# Patient Record
Sex: Female | Born: 1990 | Race: Black or African American | Hispanic: No | Marital: Married | Smoking: Never smoker
Health system: Southern US, Community
[De-identification: ages and names within clinical notes are randomized; demographics above are authoritative.]

## PROBLEM LIST (undated history)

## (undated) DIAGNOSIS — Z789 Other specified health status: Secondary | ICD-10-CM

## (undated) HISTORY — DX: Other specified health status: Z78.9

## (undated) HISTORY — PX: NO PAST SURGERIES: SHX2092

---

## 2013-05-11 DIAGNOSIS — L732 Hidradenitis suppurativa: Secondary | ICD-10-CM | POA: Insufficient documentation

## 2021-06-02 ENCOUNTER — Other Ambulatory Visit: Payer: Self-pay

## 2021-06-02 ENCOUNTER — Encounter: Payer: Self-pay | Admitting: Medical-Surgical

## 2021-06-02 ENCOUNTER — Ambulatory Visit (INDEPENDENT_AMBULATORY_CARE_PROVIDER_SITE_OTHER): Payer: 59 | Admitting: Medical-Surgical

## 2021-06-02 VITALS — BP 123/79 | HR 71 | Temp 99.1°F | Ht 68.0 in | Wt 164.9 lb

## 2021-06-02 DIAGNOSIS — M1712 Unilateral primary osteoarthritis, left knee: Secondary | ICD-10-CM

## 2021-06-02 DIAGNOSIS — Z114 Encounter for screening for human immunodeficiency virus [HIV]: Secondary | ICD-10-CM

## 2021-06-02 DIAGNOSIS — G8929 Other chronic pain: Secondary | ICD-10-CM

## 2021-06-02 DIAGNOSIS — L732 Hidradenitis suppurativa: Secondary | ICD-10-CM

## 2021-06-02 DIAGNOSIS — Z8349 Family history of other endocrine, nutritional and metabolic diseases: Secondary | ICD-10-CM

## 2021-06-02 DIAGNOSIS — Z1329 Encounter for screening for other suspected endocrine disorder: Secondary | ICD-10-CM

## 2021-06-02 DIAGNOSIS — Z7689 Persons encountering health services in other specified circumstances: Secondary | ICD-10-CM

## 2021-06-02 DIAGNOSIS — Z0000 Encounter for general adult medical examination without abnormal findings: Secondary | ICD-10-CM

## 2021-06-02 DIAGNOSIS — F419 Anxiety disorder, unspecified: Secondary | ICD-10-CM

## 2021-06-02 DIAGNOSIS — Z1159 Encounter for screening for other viral diseases: Secondary | ICD-10-CM

## 2021-06-02 DIAGNOSIS — M25511 Pain in right shoulder: Secondary | ICD-10-CM

## 2021-06-02 DIAGNOSIS — Z23 Encounter for immunization: Secondary | ICD-10-CM

## 2021-06-02 DIAGNOSIS — Z131 Encounter for screening for diabetes mellitus: Secondary | ICD-10-CM

## 2021-06-02 MED ORDER — CLINDAMYCIN PHOSPHATE 1 % EX SWAB: 1.0000 "application " | Freq: Two times a day (BID) | CUTANEOUS | 1 refills | Status: DC

## 2021-06-02 NOTE — Patient Instructions (Signed)
Tdap (Tetanus, Diphtheria, Pertussis) Vaccine: What You Need to Know 1. Why get vaccinated? Tdap vaccine can prevent tetanus, diphtheria, and pertussis. Diphtheria and pertussis spread from person to person. Tetanus enters the body through cuts or wounds.  TETANUS (T) causes painful stiffening of the muscles. Tetanus can lead to serious health problems, including being unable to open the mouth, having trouble swallowing and breathing, or death.  DIPHTHERIA (D) can lead to difficulty breathing, heart failure, paralysis, or death.  PERTUSSIS (aP), also known as "whooping cough," can cause uncontrollable, violent coughing that makes it hard to breathe, eat, or drink. Pertussis can be extremely serious especially in babies and young children, causing pneumonia, convulsions, brain damage, or death. In teens and adults, it can cause weight loss, loss of bladder control, passing out, and rib fractures from severe coughing. 2. Tdap vaccine Tdap is only for children 7 years and older, adolescents, and adults.  Adolescents should receive a single dose of Tdap, preferably at age 11 or 12 years. Pregnant people should get a dose of Tdap during every pregnancy, preferably during the early part of the third trimester, to help protect the newborn from pertussis. Infants are most at risk for severe, life-threatening complications from pertussis. Adults who have never received Tdap should get a dose of Tdap. Also, adults should receive a booster dose of either Tdap or Td (a different vaccine that protects against tetanus and diphtheria but not pertussis) every 10 years, or after 5 years in the case of a severe or dirty wound or burn. Tdap may be given at the same time as other vaccines. 3. Talk with your health care provider Tell your vaccine provider if the person getting the vaccine:  Has had an allergic reaction after a previous dose of any vaccine that protects against tetanus, diphtheria, or pertussis, or  has any severe, life-threatening allergies  Has had a coma, decreased level of consciousness, or prolonged seizures within 7 days after a previous dose of any pertussis vaccine (DTP, DTaP, or Tdap)  Has seizures or another nervous system problem  Has ever had Guillain-Barr Syndrome (also called "GBS")  Has had severe pain or swelling after a previous dose of any vaccine that protects against tetanus or diphtheria In some cases, your health care provider may decide to postpone Tdap vaccination until a future visit. People with minor illnesses, such as a cold, may be vaccinated. People who are moderately or severely ill should usually wait until they recover before getting Tdap vaccine.  Your health care provider can give you more information. 4. Risks of a vaccine reaction  Pain, redness, or swelling where the shot was given, mild fever, headache, feeling tired, and nausea, vomiting, diarrhea, or stomachache sometimes happen after Tdap vaccination. People sometimes faint after medical procedures, including vaccination. Tell your provider if you feel dizzy or have vision changes or ringing in the ears.  As with any medicine, there is a very remote chance of a vaccine causing a severe allergic reaction, other serious injury, or death. 5. What if there is a serious problem? An allergic reaction could occur after the vaccinated person leaves the clinic. If you see signs of a severe allergic reaction (hives, swelling of the face and throat, difficulty breathing, a fast heartbeat, dizziness, or weakness), call 9-1-1 and get the person to the nearest hospital. For other signs that concern you, call your health care provider.  Adverse reactions should be reported to the Vaccine Adverse Event Reporting System (VAERS). Your health   care provider will usually file this report, or you can do it yourself. Visit the VAERS website at www.vaers.hhs.gov or call 1-800-822-7967. VAERS is only for reporting  reactions, and VAERS staff members do not give medical advice. 6. The National Vaccine Injury Compensation Program The National Vaccine Injury Compensation Program (VICP) is a federal program that was created to compensate people who may have been injured by certain vaccines. Claims regarding alleged injury or death due to vaccination have a time limit for filing, which may be as short as two years. Visit the VICP website at www.hrsa.gov/vaccinecompensation or call 1-800-338-2382 to learn about the program and about filing a claim. 7. How can I learn more?  Ask your health care provider.  Call your local or state health department.  Visit the website of the Food and Drug Administration (FDA) for vaccine package inserts and additional information at www.fda.gov/vaccines-blood-biologics/vaccines.  Contact the Centers for Disease Control and Prevention (CDC): ? Call 1-800-232-4636 (1-800-CDC-INFO) or ? Visit CDC's website at www.cdc.gov/vaccines. Vaccine Information Statement Tdap (Tetanus, Diphtheria, Pertussis) Vaccine (08/01/2020) This information is not intended to replace advice given to you by your health care provider. Make sure you discuss any questions you have with your health care provider. Document Revised: 08/27/2020 Document Reviewed: 08/27/2020 Elsevier Patient Education  2021 Elsevier Inc.  

## 2021-06-02 NOTE — Progress Notes (Signed)
New Patient Office Visit  Subjective:  Patient ID: Melissa Sheppard, female    DOB: 1991-11-17  Age: 30 y.o. MRN: [id number]  CC:  Chief Complaint  Patient presents with  . Establish Care    HPI Melissa Sheppard presents to establish care. Last PCP was around 2018.   Chronic pain-does have a history of chronic pain with her left knee and right shoulder being affected.  She does go to a chiropractor once weekly and feels that this does help some.  She notes that the chiropractor discovered the osteoarthritis in her left knee.  She also has had loss cartilage in the right shoulder specifically the [facility] joint.  Notes that she did PRP in the past.  She does have a personal trainer history but feels like she needs more direction on correct exercises to do.  Not currently taking a regular anti-inflammatory but does occasionally use Aleve/ibuprofen/Motrin when it gets severe.  Hidradenitis suppurativa-was diagnosed with this many years ago and has had her bilateral axilla primarily affected.  She has had cysts and abscesses that required I&D in the past.  She was always given antibiotics after these procedures but preferred not to take them because they do cause yeast infections for her.  Most recently, she did have an abscess develop in the groin which was particularly painful.  She is interested in other options for managing her condition.   Past Medical History:  Diagnosis Date  . No pertinent past medical history     Past Surgical History:  Procedure Laterality Date  . NO PAST SURGERIES      Family History  Problem Relation Age of Onset  . Stroke Mother   . Diabetes Father   . Hypertension Other   . Breast cancer Cousin     Social History   Socioeconomic History  . Marital status: Married    Spouse name: Not on file  . Number of children: Not on file  . Years of education: Not on file  . Highest education level: Not on file  Occupational History  . Not on file  Tobacco  Use  . Smoking status: Never Smoker  . Smokeless tobacco: Never Used  Substance and Sexual Activity  . Alcohol use: Yes    Comment: occasionally  . Drug use: Never  . Sexual activity: Yes    Birth control/protection: Implant  Other Topics Concern  . Not on file  Social History Narrative  . Not on file   Social Determinants of Health   Financial Resource Strain: Not on file  Food Insecurity: Not on file  Transportation Needs: Not on file  Physical Activity: Not on file  Stress: Not on file  Social Connections: Not on file  Intimate Partner Violence: Not on file    ROS Review of Systems  Constitutional: Negative for chills, fatigue, fever and unexpected weight change.  Respiratory: Negative for cough, chest tightness, shortness of breath and wheezing.   Cardiovascular: Negative for chest pain, palpitations and leg swelling.  Musculoskeletal: Positive for arthralgias, myalgias and neck pain.  Skin:       Develops cysts in axilla and groin.  Neurological: Negative for dizziness, light-headedness and headaches.  Psychiatric/Behavioral: Positive for dysphoric mood. Negative for self-injury, sleep disturbance and suicidal ideas. The patient is nervous/anxious.     Objective:   Today's Vitals: BP 123/79   Pulse 71   Temp 99.1 F (37.3 C)   Ht 5\' 8"  (1.727 m)   Wt 164 lb 14.4  oz (74.8 kg)   LMP 05/27/2021   SpO2 100%   BMI 25.07 kg/m   Physical Exam Vitals reviewed.  Constitutional:      General: She is not in acute distress.    Appearance: Normal appearance. She is normal weight. She is not ill-appearing.  HENT:     Head: Normocephalic and atraumatic.  Cardiovascular:     Rate and Rhythm: Normal rate and regular rhythm.     Pulses: Normal pulses.     Heart sounds: Normal heart sounds. No murmur heard. No friction rub. No gallop.   Pulmonary:     Effort: Pulmonary effort is normal. No respiratory distress.     Breath sounds: Normal breath sounds. No wheezing.   Skin:    General: Skin is warm and dry.  Neurological:     Mental Status: She is alert and oriented to person, place, and time.  Psychiatric:        Mood and Affect: Mood normal.        Behavior: Behavior normal.        Thought Content: Thought content normal.        Judgment: Judgment normal.     Assessment & Plan:   1. Encounter to establish care Reviewed available information and discussed healthcare concerns with patient.  2. Need for Tdap vaccination Tdap given in office today. - Tdap vaccine greater than or equal to 7yo IM  3. Chronic right shoulder pain 4.  Osteoarthritis of the left knee Offered prescription anti-inflammatories but patient declined since she would like to get further evaluation before adding a medication.  Referring to formal physical therapy.  Would like her to see Dr. Karie Schwalbe for further investigation and treatment of her issues.  Recommend she gets her most recent imaging from her chiropractor so we can review it here.  Patient verbalized understanding and is agreeable to the plan. - Ambulatory referral to Physical Therapy  5. Encounter for screening for HIV 6. Need for hepatitis C screening test Reviewed screening recommendations.  Patient is agreeable so adding to blood work today. - HIV Antibody (routine testing w rflx) - Hepatitis C antibody  7. Preventative health care Checking CBC with differential, CMP, and lipid panel today. - CBC with Differential/Platelet - COMPLETE METABOLIC PANEL WITH GFR - Lipid panel  8. Family history of thyroid disease 9. Thyroid disorder screening Checking TSH today. - TSH  9. Diabetes mellitus screening Checking hemoglobin A1c. - Hemoglobin A1c  10. Anxiety Referring to behavioral health for counseling per patient request. - Ambulatory referral to Behavioral Health  11. Hidradenitis suppurativa No current abscesses to address today but she would like to investigate management options to prevent them.   Starting clindamycin 1% topical twice daily.  Outpatient Encounter Medications as of 06/02/2021  Medication Sig  . clindamycin (CLEOCIN T) 1 % SWAB Apply 1 application topically 2 (two) times daily.  Marland Kitchen etonogestrel (NEXPLANON) 68 MG IMPL implant Inject 1 Device into the skin once. Placed 01/2019   No facility-administered encounter medications on file as of 06/02/2021.   Follow-up: Return for chronic joint pain follow up with Dr. Karie Schwalbe.   Thayer Ohm, DNP, APRN, FNP-BC [facility] and Sports Medicine

## 2021-06-03 LAB — COMPLETE METABOLIC PANEL WITH GFR
AG Ratio: 1.8 (calc) (ref 1.0–2.5)
ALT: 12 U/L (ref 6–29)
AST: 13 U/L (ref 10–30)
Albumin: 4.4 g/dL (ref 3.6–5.1)
Alkaline phosphatase (APISO): 45 U/L (ref 31–125)
BUN/Creatinine Ratio: 18 (calc) (ref 6–22)
BUN: 20 mg/dL (ref 7–25)
CO2: 27 mmol/L (ref 20–32)
Calcium: 9.6 mg/dL (ref 8.6–10.2)
Chloride: 106 mmol/L (ref 98–110)
Creat: 1.12 mg/dL — ABNORMAL HIGH (ref 0.50–1.10)
GFR, Est African American: 76 mL/min/{1.73_m2} (ref >=60)
GFR, Est Non African American: 65 mL/min/{1.73_m2} (ref >=60)
Globulin: 2.4 g/dL (calc) (ref 1.9–3.7)
Glucose, Bld: 91 mg/dL (ref 65–99)
Potassium: 4.3 mmol/L (ref 3.5–5.3)
Sodium: 139 mmol/L (ref 135–146)
Total Bilirubin: 0.2 mg/dL (ref 0.2–1.2)
Total Protein: 6.8 g/dL (ref 6.1–8.1)

## 2021-06-03 LAB — CBC WITH DIFFERENTIAL/PLATELET
Absolute Monocytes: 502 cells/uL (ref 200–950)
Basophils Absolute: 30 cells/uL (ref 0–200)
Basophils Relative: 0.5 %
Eosinophils Absolute: 189 cells/uL (ref 15–500)
Eosinophils Relative: 3.2 %
HCT: 40.3 % (ref 35.0–45.0)
Hemoglobin: 13.2 g/dL (ref 11.7–15.5)
Lymphs Abs: 2053 cells/uL (ref 850–3900)
MCH: 28.4 pg (ref 27.0–33.0)
MCHC: 32.8 g/dL (ref 32.0–36.0)
MCV: 86.9 fL (ref 80.0–100.0)
MPV: 9.7 fL (ref 7.5–12.5)
Monocytes Relative: 8.5 %
Neutro Abs: 3127 cells/uL (ref 1500–7800)
Neutrophils Relative %: 53 %
Platelets: 238 10*3/uL (ref 140–400)
RBC: 4.64 10*6/uL (ref 3.80–5.10)
RDW: 11.6 % (ref 11.0–15.0)
Total Lymphocyte: 34.8 %
WBC: 5.9 10*3/uL (ref 3.8–10.8)

## 2021-06-03 LAB — HEPATITIS C ANTIBODY
Hepatitis C Ab: NONREACTIVE
SIGNAL TO CUT-OFF: 0 (ref <1.00)

## 2021-06-03 LAB — TSH: TSH: 0.61 mIU/L

## 2021-06-03 LAB — LIPID PANEL
Cholesterol: 171 mg/dL (ref <200)
HDL: 62 mg/dL (ref >=50)
LDL Cholesterol (Calc): 90 mg/dL (calc)
Non-HDL Cholesterol (Calc): 109 mg/dL (calc) (ref <130)
Total CHOL/HDL Ratio: 2.8 (calc) (ref <5.0)
Triglycerides: 95 mg/dL (ref <150)

## 2021-06-03 LAB — HEMOGLOBIN A1C
Hgb A1c MFr Bld: 4.9 % of total Hgb (ref <5.7)
Mean Plasma Glucose: 94 mg/dL
eAG (mmol/L): 5.2 mmol/L

## 2021-06-03 LAB — HIV ANTIBODY (ROUTINE TESTING W REFLEX): HIV 1&2 Ab, 4th Generation: NONREACTIVE

## 2021-06-08 ENCOUNTER — Other Ambulatory Visit: Payer: Self-pay

## 2021-06-09 ENCOUNTER — Other Ambulatory Visit: Payer: Self-pay

## 2021-06-09 ENCOUNTER — Ambulatory Visit (INDEPENDENT_AMBULATORY_CARE_PROVIDER_SITE_OTHER): Payer: 59 | Admitting: Sports Medicine

## 2021-06-09 ENCOUNTER — Ambulatory Visit (INDEPENDENT_AMBULATORY_CARE_PROVIDER_SITE_OTHER): Payer: 59

## 2021-06-09 DIAGNOSIS — M542 Cervicalgia: Secondary | ICD-10-CM

## 2021-06-09 DIAGNOSIS — G8929 Other chronic pain: Secondary | ICD-10-CM

## 2021-06-09 DIAGNOSIS — M25511 Pain in right shoulder: Secondary | ICD-10-CM | POA: Diagnosis not present

## 2021-06-09 MED ORDER — MELOXICAM 15 MG PO TABS: ORAL_TABLET | ORAL | 3 refills | Status: DC

## 2021-06-09 NOTE — Progress Notes (Signed)
    Procedures performed today:    None.  Independent interpretation of notes and tests performed by another provider:   None.  Brief History, Exam, Impression, and Recommendations:    Right shoulder pain This is a pleasant 30 year old female, she has had several months of pain in her right shoulder, localized over the deltoid, as well as in the axilla and periscapular region, worse with reaching out. She tells me she did see a chiropractor in the past who told her that she had some "cartilage loss" in her shoulder. On exam she for the most part has a normal shoulder exam, normal cervical spine exam, maybe a little bit of discomfort with speeds test, normal Yergason test, no impingement signs, very mildly positive O'Brien's test. She has seen a chiropractor without much improvement. Etiology is unclear at this juncture as to whether this is coming from her neck or her shoulder, adding some x-rays, cervical spine neck. Aggressive formal physical therapy, meloxicam, return to see me in 6 weeks, we will MRI the structure which declares itself as the primary pain generator at the follow-up visit.    ___________________________________________ Ihor Austin. Benjamin Stain, M.D., ABFM., CAQSM. Primary Care and Sports Medicine [facility]  Adjunct Instructor of Family Medicine  [employer]

## 2021-06-09 NOTE — Assessment & Plan Note (Signed)
This is a pleasant 30 year old female, she has had several months of pain in her right shoulder, localized over the deltoid, as well as in the axilla and periscapular region, worse with reaching out. She tells me she did see a chiropractor in the past who told her that she had some "cartilage loss" in her shoulder. On exam she for the most part has a normal shoulder exam, normal cervical spine exam, maybe a little bit of discomfort with speeds test, normal Yergason test, no impingement signs, very mildly positive O'Brien's test. She has seen a chiropractor without much improvement. Etiology is unclear at this juncture as to whether this is coming from her neck or her shoulder, adding some x-rays, cervical spine neck. Aggressive formal physical therapy, meloxicam, return to see me in 6 weeks, we will MRI the structure which declares itself as the primary pain generator at the follow-up visit.

## 2021-06-11 ENCOUNTER — Ambulatory Visit (INDEPENDENT_AMBULATORY_CARE_PROVIDER_SITE_OTHER): Payer: 59 | Admitting: Rehabilitative and Restorative Service Providers"

## 2021-06-11 ENCOUNTER — Other Ambulatory Visit: Payer: Self-pay

## 2021-06-11 DIAGNOSIS — M6281 Muscle weakness (generalized): Secondary | ICD-10-CM

## 2021-06-11 DIAGNOSIS — R29898 Other symptoms and signs involving the musculoskeletal system: Secondary | ICD-10-CM | POA: Diagnosis not present

## 2021-06-11 DIAGNOSIS — M25511 Pain in right shoulder: Secondary | ICD-10-CM

## 2021-06-11 DIAGNOSIS — R293 Abnormal posture: Secondary | ICD-10-CM

## 2021-06-11 DIAGNOSIS — G8929 Other chronic pain: Secondary | ICD-10-CM

## 2021-06-11 NOTE — Patient Instructions (Signed)
Access Code: [email or web address]: [email or web address]: 06/16/2022Prepared by: Karlissa Aron HoltExercises  Seated Cervical Retraction - 3 x daily - 7 x weekly - 10 reps - 1 sets  Standing Scapular Retraction - 3 x daily - 7 x weekly - 10 reps - 1 sets - 10 hold  Shoulder External Rotation and Scapular Retraction - 3 x daily - 7 x weekly - 10 reps - 1 sets - hold  Standing Scapular Retraction in Abduction - 3 x daily - 7 x weekly - 10 reps - 1 sets  Doorway Pec Stretch at 60 Degrees Abduction - 3 x daily - 7 x weekly - 3 reps - 1 sets  Doorway Pec Stretch at 90 Degrees Abduction - 3 x daily - 7 x weekly - 3 reps - 1 sets - 30 seconds hold  Doorway Pec Stretch at 120 Degrees Abduction - 3 x daily - 7 x weekly - 3 reps - 1 sets - 30 second hold hold Patient Education  Arboriculturist

## 2021-06-11 NOTE — Therapy (Signed)
[facility] [place] [address] [place], [place], [address] Phone: [phone]   Fax:  [phone]  Physical Therapy Evaluation  Patient Details  Name: Melissa Sheppard MRN: [id number] Date of Birth: 04/18/1991 Referring Provider (PT): Dr Benjamin Stain   Encounter Date: 06/11/2021   PT End of Session - 06/11/21 1039     Visit Number 1    Number of Visits 12    Date for PT Re-Evaluation 07/23/21    PT Start Time 0801    PT Stop Time 0846    PT Time Calculation (min) 45 min    Activity Tolerance Patient tolerated treatment well             Past Medical History:  Diagnosis Date   No pertinent past medical history     Past Surgical History:  Procedure Laterality Date   NO PAST SURGERIES      There were no vitals filed for this visit.    Subjective Assessment - 06/11/21 0803     Subjective Patient reports that she has had Rt shoulder pain for several years. Had a fall 2018 and fell from high stool and struck Rt side. She has had pain since that time. 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 She is working out less in the past couple of years. Noticed some numbness in the Lt thumb and ling finger yesterday for brief period of time - first time and resolved quickly w/ stretching. Patient received PRP injection ~ 2017    Pertinent History arthritis Rt knee; chronic Rt shoulder pain; anxiety    Patient Stated Goals get rid of pain and learn HEP    Currently in Pain? Yes    Pain Score 5     Pain Location Shoulder    Pain Orientation Right    Pain Descriptors / Indicators Nagging;Pounding;Aching;Constant    Pain Type Chronic pain    Pain Radiating Towards into neck and rib area    Pain Onset More than a month ago    Pain Frequency Constant    Aggravating Factors  sitting for prolonged periods of time; punching; desk job    Pain Relieving Factors stretching; heat; meds                 [facility] PT Assessment - 06/11/21 0001       Assessment   Medical Diagnosis Chronic Rt shoulder pain    Referring Provider (PT) Dr Benjamin Stain    Onset Date/Surgical Date 12/27/20   symptoms since 2017   Hand Dominance Right    Next MD Visit to schedule    Prior Therapy chiropractic care weekly      Precautions   Precautions None      Restrictions   Weight Bearing Restrictions No      Balance Screen   Has the patient fallen in the past 6 months No    Has the patient had a decrease in activity level because of a fear of falling?  No    Is the patient reluctant to leave their home because of a fear of falling?  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    Leisure household chores; cooking; Clinical cytogeneticist  games; gardening show; car shows      Observation/Other Assessments   Focus on Therapeutic Outcomes (FOTO)  54      Sensation   Additional Comments WFL's per pt report      Posture/Postural Control   Posture Comments head forward; shoulders rounded and eevated; head of the humerus anterior in orientation      AROM   Right Shoulder Extension 60 Degrees    Right Shoulder Flexion 154 Degrees    Right Shoulder ABduction 152 Degrees   tight   Right Shoulder Internal Rotation --   thumb T8 tight stiff   Right Shoulder External Rotation 90 Degrees    Left Shoulder Extension 45 Degrees    Left Shoulder Flexion 159 Degrees    Left Shoulder ABduction 152 Degrees    Left Shoulder Internal Rotation --   thumb T7   Left Shoulder External Rotation 98 Degrees      Strength   Overall Strength Comments WFL's except lower trap Rt 5-/5      Palpation   Spinal mobility hypomobile thoracic and lower cervical with PA mobs    Palpation comment muscular tightness ant/lat/post cervical  musculatuture; pecs; upper trap; leveator; teres; thoracic paraspinals                        Objective measurements completed on examination: See above findings.       [facility] Adult PT Treatment/Exercise - 06/11/21 0001       Self-Care   Self-Care Other Self-Care Comments    Other Self-Care Comments  myofacial ball release standing 4 in plastic ball      Therapeutic Activites    Therapeutic Activities Other Therapeutic Activities    Other Therapeutic Activities initiated postural  correction      Shoulder Exercises: Supine   Other Supine Exercises prolonged snow angel on noodle ~ 2-3 min UE's at ~ 80 deg abd      Shoulder Exercises: Standing   Other Standing Exercises chin tuck 10 sec x 5; scap squeeze 10 sec x 5; l's x 10; W's x 10 noodle along spine      Shoulder Exercises: Stretch   Other Shoulder Stretches pec stretch 30 sec x 2 reps each position                    PT Education - 06/11/21 0841     Education Details HEP POC myofacial relesae work    Teacher, music) Educated Patient    Methods Explanation;Demonstration;Tactile cues;Verbal cues;Handout    Comprehension Verbalized understanding;Returned demonstration;Verbal cues required;Tactile cues required                 PT Long Term Goals - 06/11/21 1046       PT LONG TERM GOAL #1   Title Improve posture and alignment with patient to demonstrate improved posture with posterior shoulder girdle musculature engaged    Time 6    Period Weeks    Status New    Target Date 07/23/21      PT LONG TERM GOAL #2   Title Increase bilat shoulder flexion by 5-7 degrees    Time 6    Period Weeks    Status New    Target Date 07/23/21      PT LONG TERM GOAL #3   Title Decrease pain Rt shoulder by 50-75% in frequency, intensity,and/or duration    Time 6    Period Weeks    Status  New    Target Date 07/23/21      PT LONG TERM GOAL #4   Title Independent in HEP    Time 6    Period Weeks     Status New    Target Date 07/23/21      PT LONG TERM GOAL #5   Title Improve functional limitation score to 73    Time 6    Period Weeks    Status New    Target Date 07/23/21                    Plan - 06/11/21 1040     Clinical Impression Statement Patient presents with ~ 6 month history of increased Rt shoulder pain. She has a ~ 5 yr history of Rt shoulder pain with a fall onto the Rt shoulder ~ 2017 as well as occassional strains with activities. Patient has pain on a constant, daily basis. She has poor scapular posture and alignment; limited shoulder ROM/mobility; muscular imbalance through shoulder girdle; muscular tightness to palpation; weakness in postrerior shoulder girdle/upper core; pain which is constant and chronic in nature. Patient will benefit from PT to address problems identified. Aggressive therapy.    Stability/Clinical Decision Making Stable/Uncomplicated    Clinical Decision Making Low    Rehab Potential Good    PT Frequency 2x / week    PT Duration 6 weeks    PT Treatment/Interventions ADLs/Self Care Home Management;Aquatic Therapy;Cryotherapy;Iontophoresis 4mg /ml Dexamethasone;Moist Heat;Ultrasound;Functional mobility training;Therapeutic activities;Therapeutic exercise;Neuromuscular re-education;Patient/family education;Manual techniques;Dry needling;Taping    PT Next Visit Plan review HEP; progress with postural correction and posterior shoulder girdle strengthening; manual work vs DN to pecs/upper trap/leveator/teres; has TENS unit at home    PT Home Exercise Plan [email or web address]    Consulted and Agree with Plan of Care Patient             Patient will benefit from skilled therapeutic intervention in order to improve the following deficits and impairments:  Decreased range of motion, Decreased activity tolerance, Pain, Hypomobility, Impaired flexibility, Improper body mechanics, Decreased mobility, Decreased strength, Postural dysfunction  Visit  Diagnosis: Chronic right shoulder pain  Abnormal posture  Other symptoms and signs involving the musculoskeletal system  Muscle weakness (generalized)     Problem List Patient Active Problem List   Diagnosis Date Noted   Right shoulder pain 06/09/2021    Melissa Sheppard 06/11/2021 PT, MPH  06/11/2021, 10:53 AM  [facility] [address] [place], [place], [place] Phone: [phone]   Fax:  [phone]  Name: Melissa Sheppard MRN: Lyndon Code Date of Birth: 02-05-1991

## 2021-06-16 ENCOUNTER — Other Ambulatory Visit: Payer: Self-pay

## 2021-06-16 ENCOUNTER — Encounter: Payer: Self-pay | Admitting: Physical Therapy

## 2021-06-16 ENCOUNTER — Ambulatory Visit (INDEPENDENT_AMBULATORY_CARE_PROVIDER_SITE_OTHER): Payer: 59 | Admitting: Physical Therapy

## 2021-06-16 DIAGNOSIS — G8929 Other chronic pain: Secondary | ICD-10-CM

## 2021-06-16 DIAGNOSIS — R29898 Other symptoms and signs involving the musculoskeletal system: Secondary | ICD-10-CM

## 2021-06-16 DIAGNOSIS — M25511 Pain in right shoulder: Secondary | ICD-10-CM | POA: Diagnosis not present

## 2021-06-16 DIAGNOSIS — M6281 Muscle weakness (generalized): Secondary | ICD-10-CM

## 2021-06-16 DIAGNOSIS — R293 Abnormal posture: Secondary | ICD-10-CM | POA: Diagnosis not present

## 2021-06-16 NOTE — Therapy (Signed)
[facility] [place] [address] [place], [place], [address] Phone: [phone]   Fax:  [phone]  Physical Therapy Treatment  Patient Details  Name: Melissa Sheppard MRN: [id number] Date of Birth: [birth year] Referring Provider (PT): Dr Benjamin Stain   Encounter Date: 06/16/2021   PT End of Session - 06/16/21 0845     Visit Number 2    Number of Visits 12    Date for PT Re-Evaluation 07/23/21    PT Start Time 0846    PT Stop Time 0930    PT Time Calculation (min) 44 min    Activity Tolerance Patient tolerated treatment well             Past Medical History:  Diagnosis Date   No pertinent past medical history     Past Surgical History:  Procedure Laterality Date   NO PAST SURGERIES      There were no vitals filed for this visit.   Subjective Assessment - 06/16/21 0847     Subjective Feeling the same as last week.  Has been doing the exercises 2x/ day.  Tingliing in arm has gone away.    Currently in Pain? Yes    Pain Score 2     Pain Location Shoulder    Pain Orientation Right;Anterior    Pain Descriptors / Indicators Dull    Aggravating Factors  punching, prolonged periods of time    Pain Relieving Factors stretching, heat, meds.                [facility] PT Assessment - 06/16/21 0001       Assessment   Medical Diagnosis Chronic Rt shoulder pain    Referring Provider (PT) Dr Benjamin Stain    Onset Date/Surgical Date 12/27/20   symptoms since 2017   Hand Dominance Right    Next MD Visit to schedule    Prior Therapy chiropractic care weekly               [facility] Adult PT Treatment/Exercise - 06/16/21 0001       Shoulder Exercises: Seated   Other Seated Exercises L's x 5 sec x 10, w's x 5 sec x 5 reps.  scap retraction x 5 sec x 5 reps    Other Seated Exercises chin tucks x 5 sec x 5 reps with minor tactile cues.      Shoulder Exercises: Sidelying   Other Sidelying Exercises open book sweep  (pt's version) x 3 each side. then 5 reps with green band (traditional book) each side, then hand behind head x 3 each side      Shoulder Exercises: Stretch   Other Shoulder Stretches 3 position doorway stretch, 15-30 sec x 2 reps each. 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              PT Long Term Goals - 06/11/21 1046       PT LONG TERM GOAL #1   Title Improve posture and alignment  with patient to demonstrate improved posture with posterior shoulder girdle musculature engaged    Time 6    Period Weeks    Status New    Target Date 07/23/21      PT LONG TERM GOAL #2   Title Increase bilat shoulder flexion by 5-7 degrees    Time 6    Period Weeks    Status New    Target Date 07/23/21      PT LONG TERM GOAL #3   Title Decrease pain Rt shoulder by 50-75% in frequency, intensity,and/or duration    Time 6    Period Weeks    Status New    Target Date 07/23/21      PT LONG TERM GOAL #4   Title Independent in HEP    Time 6    Period Weeks    Status New    Target Date 07/23/21      PT LONG TERM GOAL #5   Title Improve functional limitation score to 73    Time 6    Period Weeks    Status New    Target Date 07/23/21                   Plan - 06/16/21 0915     Clinical Impression Statement With pt's history of being personal trainer, she required very limited cues on body mechanics.  Reviewed exercises and trialed additional stretch. Pt reports reduction of pain in Rt ant shoulder after IASTM and ktape  application.  Progressing towards goals.    Stability/Clinical Decision Making Stable/Uncomplicated    Rehab Potential Good    PT Frequency 2x / week    PT Duration 6 weeks    PT Treatment/Interventions ADLs/Self Care Home Management;Aquatic Therapy;Cryotherapy;Iontophoresis 4mg /ml Dexamethasone;Moist Heat;Ultrasound;Functional mobility training;Therapeutic activities;Therapeutic exercise;Neuromuscular re-education;Patient/family education;Manual techniques;Dry needling;Taping    PT Next Visit Plan review HEP; progress with postural correction and posterior shoulder girdle strengthening; manual work vs DN to pecs/upper trap/leveator/teres; has TENS unit at home    PT Home Exercise Plan [email or web address]    Consulted and Agree with Plan of Care Patient             Patient will benefit from skilled therapeutic intervention in order to improve the following deficits and impairments:  Decreased range of motion, Decreased activity tolerance, Pain, Hypomobility, Impaired flexibility, Improper body mechanics, Decreased mobility, Decreased strength, Postural dysfunction  Visit Diagnosis: Chronic right shoulder pain  Abnormal posture  Other symptoms and signs involving the musculoskeletal system  Muscle weakness (generalized)     Problem List Patient Active Problem List   Diagnosis Date Noted   Right shoulder pain 06/09/2021   06/11/2021, PTA 06/16/21 9:58 AM  [facility] [place] [address] [place], [place], [place] Phone: [phone]   Fax:  [phone]  Name: Melissa Sheppard MRN: Lyndon Code Date of Birth: Jan 04, 1991

## 2021-06-19 ENCOUNTER — Telehealth (INDEPENDENT_AMBULATORY_CARE_PROVIDER_SITE_OTHER): Payer: 59 | Admitting: Medical-Surgical

## 2021-06-19 ENCOUNTER — Ambulatory Visit (INDEPENDENT_AMBULATORY_CARE_PROVIDER_SITE_OTHER): Payer: 59 | Admitting: Rehabilitative and Restorative Service Providers"

## 2021-06-19 ENCOUNTER — Other Ambulatory Visit: Payer: Self-pay

## 2021-06-19 ENCOUNTER — Encounter: Payer: Self-pay | Admitting: Medical-Surgical

## 2021-06-19 DIAGNOSIS — M25511 Pain in right shoulder: Secondary | ICD-10-CM | POA: Diagnosis not present

## 2021-06-19 DIAGNOSIS — R293 Abnormal posture: Secondary | ICD-10-CM | POA: Diagnosis not present

## 2021-06-19 DIAGNOSIS — M6281 Muscle weakness (generalized): Secondary | ICD-10-CM

## 2021-06-19 DIAGNOSIS — L02214 Cutaneous abscess of groin: Secondary | ICD-10-CM | POA: Diagnosis not present

## 2021-06-19 DIAGNOSIS — G8929 Other chronic pain: Secondary | ICD-10-CM

## 2021-06-19 DIAGNOSIS — R29898 Other symptoms and signs involving the musculoskeletal system: Secondary | ICD-10-CM

## 2021-06-19 MED ORDER — FLUCONAZOLE 150 MG PO TABS: 150.0000 mg | ORAL_TABLET | Freq: Once | ORAL | 0 refills | Status: AC

## 2021-06-19 MED ORDER — DOXYCYCLINE HYCLATE 100 MG PO TABS: 100.0000 mg | ORAL_TABLET | Freq: Two times a day (BID) | ORAL | 0 refills | Status: AC

## 2021-06-19 NOTE — Progress Notes (Signed)
Virtual Visit via Video Note  I connected with Melissa Sheppard on 06/19/21 at  2:00 PM EDT by a video enabled telemedicine application and verified that I am speaking with the correct person using two identifiers.   I discussed the limitations of evaluation and management by telemedicine and the availability of in person appointments. The patient expressed understanding and agreed to proceed.  Patient location: home Provider locations: office  Subjective:    CC: Groin abscess  HPI: Pleasant 30 year old female presenting via MyChart video visit with reports of an abscess that developed in her groin along the panty line.  She noticed it on Monday evening/Tuesday morning.  She was using clindamycin cleansing cloths as prescribed until the abscess opened.  Once it opened, the cleansing cloths were very painful to use.  She has had some yellow drainage that is blood-tinged over the last few days.  Notes that the abscess has gotten smaller but has not fully resolved.  Has been using triple antibiotic ointment and trying to keep covered as much as possible.  Having difficulty because Band-Aids will stick to the area and can be very difficult to pull off.  She also tried boil relief cream that was available over-the-counter and warm compresses.  Denies fever, chills, myalgias.  Past medical history, Surgical history, Family history not pertinant except as noted below, Social history, Allergies, and medications have been entered into the medical record, reviewed, and corrections made.   Review of Systems: See HPI for pertinent positives and negatives.   Objective:    General: Speaking clearly in complete sentences without any shortness of breath.  Alert and oriented x3.  Normal judgment. No apparent acute distress.  Impression and Recommendations:    1. Groin abscess Treating with doxycycline 100 mg twice daily x7 days.  Sending in Diflucan x2 to take 1 with her first dose of doxycycline and  then the second 3 days later for vulvovaginal candidiasis prevention.  Continue using warm compresses.  Once the abscess has healed, return to using clindamycin topically as directed.  I discussed the assessment and treatment plan with the patient. The patient was provided an opportunity to ask questions and all were answered. The patient agreed with the plan and demonstrated an understanding of the instructions.   The patient was advised to call back or seek an in-person evaluation if the symptoms worsen or if the condition fails to improve as anticipated.  20 minutes of non-face-to-face time was provided during this encounter.  Return if symptoms worsen or fail to improve.  Thayer Ohm, DNP, APRN, FNP-BC [facility]

## 2021-06-19 NOTE — Therapy (Signed)
[facility] [place] [address] [address] [place], [place], [address] Phone: [phone]   Fax:  [phone]  Physical Therapy Treatment  Patient Details  Name: Melissa Sheppard MRN: [id number] Date of Birth: [birth year] Referring Provider (PT): Dr Benjamin Stain   Encounter Date: 06/19/2021   PT End of Session - 06/19/21 1535     Visit Number 3    Number of Visits 12    Date for PT Re-Evaluation 07/23/21    PT Start Time 1530    PT Stop Time 1608   stayed x 8 more minutes for heat s/p dry needling   PT Time Calculation (min) 38 min    Activity Tolerance Patient tolerated treatment well             Past Medical History:  Diagnosis Date   No pertinent past medical history     Past Surgical History:  Procedure Laterality Date   NO PAST SURGERIES      There were no vitals filed for this visit.   Subjective Assessment - 06/19/21 1533     Subjective The patient gets pain worse later in the day.  She does not have pain in the arm in the morning. Tingling sensation remains resolved.    Pertinent History arthritis Rt knee; chronic Rt shoulder pain; anxiety    Patient Stated Goals get rid of pain and learn HEP    Currently in Pain? Yes    Pain Score 2     Pain Location Shoulder    Pain Orientation Right;Anterior    Pain Descriptors / Indicators Discomfort;Sore;Dull    Pain Type Chronic pain    Pain Onset More than a month ago    Pain Frequency Constant    Aggravating Factors  gets worse in the afternoon    Pain Relieving Factors stretching, heat, meds                [facility] PT Assessment - 06/19/21 1536       Assessment   Medical Diagnosis Chronic Rt shoulder pain    Referring Provider (PT) Dr Benjamin Stain    Onset Date/Surgical Date 12/27/20    Hand Dominance Right    Next MD Visit to schedule                           [facility] Adult PT Treatment/Exercise - 06/19/21 1537       Exercises    Exercises Shoulder      Shoulder Exercises: Standing   Row Strengthening;Both;12 reps    Theraband Level (Shoulder Row) Level 3 (Green)    Retraction Strengthening;Both;10 reps    Theraband Level (Shoulder Retraction) Level 3 (Green)      Shoulder Exercises: ROM/Strengthening   UBE (Upper Arm Bike) x 2 minutes forward, 1.5 minutes back level 2      Shoulder Exercises: Stretch   Wall Stretch - ABduction 1 rep;30 seconds      Modalities   Modalities Moist Heat      Moist Heat Therapy   Number Minutes Moist Heat 10 Minutes    Moist Heat Location Shoulder;Cervical      Manual Therapy   Manual Therapy Soft tissue mobilization    Manual therapy comments skilled palpation to assess response to STM and DN    Soft tissue mobilization IASTM and STM for R anterior shoulder including pec, anterior deltoid, biceps; upper trap and levator STM  Trigger Point Dry Needling - 06/19/21 1617     Consent Given? Yes    Education Handout Provided Yes    Muscles Treated Head and Neck Upper trapezius;Levator scapulae    Muscles Treated Upper Quadrant Deltoid;Biceps;Latissimus dorsi    Dry Needling Comments right side    Upper Trapezius Response Twitch reponse elicited;Palpable increased muscle length    Levator Scapulae Response Twitch response elicited;Palpable increased muscle length    Deltoid Response Palpable increased muscle length    Latissimus dorsi Response Palpable increased muscle length    Biceps Response Palpable increased muscle length                       PT Long Term Goals - 06/11/21 1046       PT LONG TERM GOAL #1   Title Improve posture and alignment with patient to demonstrate improved posture with posterior shoulder girdle musculature engaged    Time 6    Period Weeks    Status New    Target Date 07/23/21      PT LONG TERM GOAL #2   Title Increase bilat shoulder flexion by 5-7 degrees    Time 6    Period Weeks    Status New    Target  Date 07/23/21      PT LONG TERM GOAL #3   Title Decrease pain Rt shoulder by 50-75% in frequency, intensity,and/or duration    Time 6    Period Weeks    Status New    Target Date 07/23/21      PT LONG TERM GOAL #4   Title Independent in HEP    Time 6    Period Weeks    Status New    Target Date 07/23/21      PT LONG TERM GOAL #5   Title Improve functional limitation score to 73    Time 6    Period Weeks    Status New    Target Date 07/23/21                   Plan - 06/19/21 1614     Clinical Impression Statement The patient has good ROM, however pain with palpation and significant myofascial tightness.  PT trialed DN today and ended with heat to reduce post needling soreness.  The patient is to continue current HEP.    PT Treatment/Interventions ADLs/Self Care Home Management;Aquatic Therapy;Cryotherapy;Iontophoresis 4mg /ml Dexamethasone;Moist Heat;Ultrasound;Functional mobility training;Therapeutic activities;Therapeutic exercise;Neuromuscular re-education;Patient/family education;Manual techniques;Dry needling;Taping    PT Next Visit Plan How did patient feel post dry needling?  review HEP, progress postural correction + posterior shoulder girld strengthening, STM/DN to pecs, upper trap, levator, teres; patient has TENS unit at home.    PT Home Exercise Plan [email or web address]    Consulted and Agree with Plan of Care Patient             Patient will benefit from skilled therapeutic intervention in order to improve the following deficits and impairments:     Visit Diagnosis: Chronic right shoulder pain  Abnormal posture  Other symptoms and signs involving the musculoskeletal system  Muscle weakness (generalized)     Problem List Patient Active Problem List   Diagnosis Date Noted   Right shoulder pain 06/09/2021   Hidradenitis suppurativa 05/11/2013    Charlina [place], PT 06/19/2021, 4:30 PM  [facility] [address] [place], [place], [place] Phone: [phone]   Fax:  [phone]  Name: Melissa Sheppard MRN: [id number] Date of Birth: 1991/05/10

## 2021-06-22 ENCOUNTER — Encounter: Payer: Self-pay | Admitting: Rehabilitative and Restorative Service Providers"

## 2021-06-22 ENCOUNTER — Ambulatory Visit (INDEPENDENT_AMBULATORY_CARE_PROVIDER_SITE_OTHER): Payer: 59 | Admitting: Rehabilitative and Restorative Service Providers"

## 2021-06-22 ENCOUNTER — Other Ambulatory Visit: Payer: Self-pay

## 2021-06-22 DIAGNOSIS — R293 Abnormal posture: Secondary | ICD-10-CM

## 2021-06-22 DIAGNOSIS — R29898 Other symptoms and signs involving the musculoskeletal system: Secondary | ICD-10-CM | POA: Diagnosis not present

## 2021-06-22 DIAGNOSIS — M6281 Muscle weakness (generalized): Secondary | ICD-10-CM | POA: Diagnosis not present

## 2021-06-22 DIAGNOSIS — G8929 Other chronic pain: Secondary | ICD-10-CM

## 2021-06-22 DIAGNOSIS — M25511 Pain in right shoulder: Secondary | ICD-10-CM

## 2021-06-22 NOTE — Patient Instructions (Signed)
Access Code: [email or web address]: [email or web address]: 06/27/2022Prepared by: Perle Brickhouse HoltExercises  Seated Cervical Retraction - 3 x daily - 7 x weekly - 10 reps - 1 sets  Standing Scapular Retraction - 3 x daily - 7 x weekly - 10 reps - 1 sets - 10 hold  Shoulder External Rotation and Scapular Retraction - 3 x daily - 7 x weekly - 10 reps - 1 sets - hold  Standing Scapular Retraction in Abduction - 3 x daily - 7 x weekly - 10 reps - 1 sets  Doorway Pec Stretch at 60 Degrees Abduction - 3 x daily - 7 x weekly - 3 reps - 1 sets  Doorway Pec Stretch at 90 Degrees Abduction - 3 x daily - 7 x weekly - 3 reps - 1 sets - 30 seconds hold  Doorway Pec Stretch at 120 Degrees Abduction - 3 x daily - 7 x weekly - 3 reps - 1 sets - 30 second hold hold  Prone Scapular Retraction - 2 x daily - 7 x weekly - 1 sets - 5-10 reps - 3-5 sec hold  Prone W Scapular Retraction - 2 x daily - 7 x weekly - 1 sets - 5-10 reps - 3-5 sec hold  Prone Scapular Retraction in Abduction - 2 x daily - 7 x weekly - 1 sets - 5-10 reps - 3-5 sec hold  Prone Scapular Retraction Y - 2 x daily - 7 x weekly - 1 sets - 3-5 reps - 5-10 sec hold  Prone Scapular Retraction in Flexion - 2 x daily - 7 x weekly - 1 sets - 5-10 reps - 3-5 sec hold  Standing Shoulder External Rotation with Resistance - 2 x daily - 7 x weekly - 1-3 sets - 10 reps - 2-3 sec hold

## 2021-06-22 NOTE — Therapy (Signed)
[facility] Out[facility] [place] [address] [place] [place], [place], [address] Phone: [phone]   Fax:  [phone]  Physical Therapy Treatment  Patient Details  Name: Melissa Sheppard MRN: [id number] Date of Birth: 18-Aug-1991 Referring Provider (PT): Dr Benjamin Stain   Encounter Date: 06/22/2021   PT End of Session - 06/22/21 0716     Visit Number 4    Number of Visits 12    Date for PT Re-Evaluation 07/23/21    PT Start Time 0715    PT Stop Time 0808    PT Time Calculation (min) 53 min    Activity Tolerance Patient tolerated treatment well             Past Medical History:  Diagnosis Date   No pertinent past medical history     Past Surgical History:  Procedure Laterality Date   NO PAST SURGERIES      There were no vitals filed for this visit.   Subjective Assessment - 06/22/21 0721     Subjective Doing pretty well. less pain in the Rt shoulder. Working on her stretches in the morning. DN seemed to do OK last visit. Some stiffness and tightness from driving 2 hours Saturday and [place].    Currently in Pain? Yes    Pain Score 2     Pain Location Shoulder    Pain Orientation Right;Anterior    Pain Descriptors / Indicators Tightness;Discomfort    Pain Type Chronic pain    Pain Onset More than a month ago    Pain Frequency Intermittent                [facility] PT Assessment - 06/22/21 0001       Assessment   Medical Diagnosis Chronic Rt shoulder pain    Referring Provider (PT) Dr Benjamin Stain    Onset Date/Surgical Date 12/27/20    Hand Dominance Right    Next MD Visit to schedule    Prior Therapy chiropractic care weekly      AROM   Right Shoulder Extension 60 Degrees    Right Shoulder Flexion 158 Degrees    Right Shoulder ABduction 152 Degrees   tight   Right Shoulder Internal Rotation --   Thumb to T8   Right Shoulder External Rotation 90 Degrees    Left Shoulder Extension 45  Degrees    Left Shoulder Flexion 159 Degrees    Left Shoulder ABduction 152 Degrees    Left Shoulder External Rotation 98 Degrees                           [facility] Adult PT Treatment/Exercise - 06/22/21 0001       Shoulder Exercises: Prone   Other Prone Exercises prone series 5 sec hold x 5 reps - arms at side; W; T; airplane; Y; superman      Shoulder Exercises: Standing   Extension Strengthening;Both;10 reps;Theraband    Theraband Level (Shoulder Extension) Level 4 (Blue)    Row Strengthening;Both;10 reps;Theraband    Theraband Level (Shoulder Row) Level 4 (Blue)    Row Limitations bow and arrow x 10 each side blue TB    Retraction Strengthening;Both;20 reps;Theraband    Theraband Level (Shoulder Retraction) Level 2 (Red)      Shoulder Exercises: ROM/Strengthening   UBE (Upper Arm Bike) 4 min alt fwd/back each min      Shoulder Exercises: Stretch   Wall Stretch -  ABduction 2 reps;30 seconds    Other Shoulder Stretches 3 position doorway stretch, 30 sec x 2 reps each. arms overhead (hands above door) x 2 reps of 30 sec      Moist Heat Therapy   Number Minutes Moist Heat 10 Minutes    Moist Heat Location Shoulder;Cervical      Manual Therapy   Manual Therapy Soft tissue mobilization    Soft tissue mobilization STM upper trap; leveator; cervical musculature; pecs/ant deltoid                    PT Education - 06/22/21 0747     Education Details HEP    Person(s) Educated Patient    Methods Explanation;Demonstration;Tactile cues;Verbal cues;Handout    Comprehension Verbalized understanding;Returned demonstration;Verbal cues required;Tactile cues required                 PT Long Term Goals - 06/11/21 1046       PT LONG TERM GOAL #1   Title Improve posture and alignment with patient to demonstrate improved posture with posterior shoulder girdle musculature engaged    Time 6    Period Weeks    Status New    Target Date 07/23/21      PT  LONG TERM GOAL #2   Title Increase bilat shoulder flexion by 5-7 degrees    Time 6    Period Weeks    Status New    Target Date 07/23/21      PT LONG TERM GOAL #3   Title Decrease pain Rt shoulder by 50-75% in frequency, intensity,and/or duration    Time 6    Period Weeks    Status New    Target Date 07/23/21      PT LONG TERM GOAL #4   Title Independent in HEP    Time 6    Period Weeks    Status New    Target Date 07/23/21      PT LONG TERM GOAL #5   Title Improve functional limitation score to 73    Time 6    Period Weeks    Status New    Target Date 07/23/21                   Plan - 06/22/21 0739     Clinical Impression Statement Progressing well toward goals of therapy. Pain no longer constant. ROM is increasing. Less palpable tightness.    Rehab Potential Good    PT Frequency 2x / week    PT Duration 6 weeks    PT Treatment/Interventions ADLs/Self Care Home Management;Aquatic Therapy;Cryotherapy;Iontophoresis 4mg /ml Dexamethasone;Moist Heat;Ultrasound;Functional mobility training;Therapeutic activities;Therapeutic exercise;Neuromuscular re-education;Patient/family education;Manual techniques;Dry needling;Taping    PT Next Visit Plan Continue posterior shoulder girdle strengthening; manual work/DN; postural correction/education    PT Home Exercise Plan [email or web address]    Consulted and Agree with Plan of Care Patient             Patient will benefit from skilled therapeutic intervention in order to improve the following deficits and impairments:     Visit Diagnosis: Chronic right shoulder pain  Abnormal posture  Other symptoms and signs involving the musculoskeletal system  Muscle weakness (generalized)     Problem List Patient Active Problem List   Diagnosis Date Noted   Right shoulder pain 06/09/2021   Hidradenitis suppurativa 05/11/2013    Melissa Sheppard 05/13/2013 PT, MPH  06/22/2021, 8:06 AM  [facility]  [place] [address] [place], [place], [address] Phone: [phone]   Fax:  [phone]  Name: Melissa Sheppard MRN: [id number] Date of Birth: 09/01/1991

## 2021-06-25 ENCOUNTER — Ambulatory Visit (INDEPENDENT_AMBULATORY_CARE_PROVIDER_SITE_OTHER): Payer: 59 | Admitting: Rehabilitative and Restorative Service Providers"

## 2021-06-25 ENCOUNTER — Encounter: Payer: Self-pay | Admitting: Rehabilitative and Restorative Service Providers"

## 2021-06-25 ENCOUNTER — Other Ambulatory Visit: Payer: Self-pay

## 2021-06-25 DIAGNOSIS — G8929 Other chronic pain: Secondary | ICD-10-CM

## 2021-06-25 DIAGNOSIS — R293 Abnormal posture: Secondary | ICD-10-CM

## 2021-06-25 DIAGNOSIS — R29898 Other symptoms and signs involving the musculoskeletal system: Secondary | ICD-10-CM | POA: Diagnosis not present

## 2021-06-25 DIAGNOSIS — M6281 Muscle weakness (generalized): Secondary | ICD-10-CM

## 2021-06-25 DIAGNOSIS — M25511 Pain in right shoulder: Secondary | ICD-10-CM | POA: Diagnosis not present

## 2021-06-25 NOTE — Patient Instructions (Signed)
Access Code: [email or web address]: [email or web address]: 06/30/2022Prepared by: Leighla Chestnutt HoltExercises  Seated Cervical Retraction - 3 x daily - 7 x weekly - 10 reps - 1 sets  Standing Scapular Retraction - 3 x daily - 7 x weekly - 10 reps - 1 sets - 10 hold  Shoulder External Rotation and Scapular Retraction - 3 x daily - 7 x weekly - 10 reps - 1 sets - hold  Standing Scapular Retraction in Abduction - 3 x daily - 7 x weekly - 10 reps - 1 sets  Doorway Pec Stretch at 60 Degrees Abduction - 3 x daily - 7 x weekly - 3 reps - 1 sets  Doorway Pec Stretch at 90 Degrees Abduction - 3 x daily - 7 x weekly - 3 reps - 1 sets - 30 seconds hold  Doorway Pec Stretch at 120 Degrees Abduction - 3 x daily - 7 x weekly - 3 reps - 1 sets - 30 second hold hold  Prone Scapular Retraction - 2 x daily - 7 x weekly - 1 sets - 5-10 reps - 3-5 sec hold  Prone W Scapular Retraction - 2 x daily - 7 x weekly - 1 sets - 5-10 reps - 3-5 sec hold  Prone Scapular Retraction in Abduction - 2 x daily - 7 x weekly - 1 sets - 5-10 reps - 3-5 sec hold  Prone Scapular Retraction Y - 2 x daily - 7 x weekly - 1 sets - 3-5 reps - 5-10 sec hold  Prone Scapular Retraction in Flexion - 2 x daily - 7 x weekly - 1 sets - 5-10 reps - 3-5 sec hold  Standing Shoulder External Rotation with Resistance - 2 x daily - 7 x weekly - 1-3 sets - 10 reps - 2-3 sec hold  Seated Trunk Rotation Stretch - 2 x daily - 7 x weekly - 1 sets - 3 reps - 30 sec hold  Supine Chest Stretch on Foam Roll - 2 x daily - 7 x weekly - 1 sets - 1 reps - 2-5 min sec hold  Seated Sidebending - 2 x daily - 7 x weekly - 1 sets - 3 reps - 15-20 sec hold

## 2021-06-25 NOTE — Therapy (Signed)
[facility] [place] [address] [place], [place], [address] Phone: [phone]   Fax:  [phone]  Physical Therapy Treatment  Patient Details  Name: Melissa Sheppard MRN: [id number] Date of Birth: [birth year] Referring Provider (PT): Dr Benjamin Stain   Encounter Date: 06/25/2021   PT End of Session - 06/25/21 1704     Visit Number 5    Number of Visits 12    Date for PT Re-Evaluation 07/23/21    PT Start Time 1702    PT Stop Time 1750    PT Time Calculation (min) 48 min    Activity Tolerance Patient tolerated treatment well             Past Medical History:  Diagnosis Date   No pertinent past medical history     Past Surgical History:  Procedure Laterality Date   NO PAST SURGERIES      There were no vitals filed for this visit.   Subjective Assessment - 06/25/21 1705     Subjective Patient reports that she is doing TB exercises but not the prone. She is working on the stretching and working on her posture.    Currently in Pain? Yes    Pain Score 3     Pain Location Shoulder    Pain Orientation Right;Anterior    Pain Descriptors / Indicators Tightness;Discomfort    Pain Type Chronic pain                               [facility] Adult PT Treatment/Exercise - 06/25/21 0001       Shoulder Exercises: Seated   Other Seated Exercises L's x 5 sec x 10, w's x 5 sec x 5 reps.  scap retraction x 5 sec x 5 reps    Other Seated Exercises chin tucks x 5 sec x 5 reps with minor tactile cues.      Shoulder Exercises: ROM/Strengthening   UBE (Upper Arm Bike) Level 4x 4 min alt fwd/back each min      Shoulder Exercises: Stretch   Wall Stretch - ABduction 2 reps;30 seconds    Other Shoulder Stretches 3 position doorway stretch, 30 sec x 2 reps each. arms overhead (hands above door) x 2 reps of 30 sec    Other Shoulder Stretches lateral trunk flexin 20 sec x 2 each side; trunk rotatin seated 15-20 sec  x 2 each side; thoracic extension seated 15-20 sec x 2 hands behind head; upper trap stretch Rt UE behind back lateral cervical flexion to Lt 15 sec hold x 3 reps      Moist Heat Therapy   Number Minutes Moist Heat 10 Minutes    Moist Heat Location Shoulder;Cervical      Manual Therapy   Manual Therapy Soft tissue mobilization    Manual therapy comments skilled palpation to assess response to STM and DN    Soft tissue mobilization STM upper trap; leveator; cervical musculature; pecs/ant deltoid              Trigger Point Dry Needling - 06/25/21 0001     Consent Given? Yes    Education Handout Provided Previously provided    Dry Needling Comments right side    Upper Trapezius Response Twitch reponse elicited;Palpable increased muscle length    Suboccipitals Response Palpable increased muscle length    Levator Scapulae Response Twitch response elicited;Palpable increased muscle length    Cervical multifidi Response  Palpable increased muscle length                  PT Education - 06/25/21 1720     Education Details HEP    Person(s) Educated Patient    Methods Explanation;Demonstration;Tactile cues;Verbal cues;Handout    Comprehension Verbalized understanding;Returned demonstration;Verbal cues required;Tactile cues required                 PT Long Term Goals - 06/11/21 1046       PT LONG TERM GOAL #1   Title Improve posture and alignment with patient to demonstrate improved posture with posterior shoulder girdle musculature engaged    Time 6    Period Weeks    Status New    Target Date 07/23/21      PT LONG TERM GOAL #2   Title Increase bilat shoulder flexion by 5-7 degrees    Time 6    Period Weeks    Status New    Target Date 07/23/21      PT LONG TERM GOAL #3   Title Decrease pain Rt shoulder by 50-75% in frequency, intensity,and/or duration    Time 6    Period Weeks    Status New    Target Date 07/23/21      PT LONG TERM GOAL #4   Title  Independent in HEP    Time 6    Period Weeks    Status New    Target Date 07/23/21      PT LONG TERM GOAL #5   Title Improve functional limitation score to 73    Time 6    Period Weeks    Status New    Target Date 07/23/21                   Plan - 06/25/21 1707     Clinical Impression Statement Some persistent tightness in the Rt shoulder and into the Rt rib area. Pain is not longer constant, less palpable tightness. Adding specific stretch for lateral trunk and stretch for upper trap.    Rehab Potential Good    PT Frequency 2x / week    PT Duration 6 weeks    PT Treatment/Interventions ADLs/Self Care Home Management;Aquatic Therapy;Cryotherapy;Iontophoresis 4mg /ml Dexamethasone;Moist Heat;Ultrasound;Functional mobility training;Therapeutic activities;Therapeutic exercise;Neuromuscular re-education;Patient/family education;Manual techniques;Dry needling;Taping    PT Next Visit Plan Continue posterior shoulder girdle strengthening; manual work/DN; postural correction/education - try lower doorway to stretch biceps; upper trap stretch    PT Home Exercise Plan [email or web address]             Patient will benefit from skilled therapeutic intervention in order to improve the following deficits and impairments:     Visit Diagnosis: Chronic right shoulder pain  Abnormal posture  Other symptoms and signs involving the musculoskeletal system  Muscle weakness (generalized)     Problem List Patient Active Problem List   Diagnosis Date Noted   Right shoulder pain 06/09/2021   Hidradenitis suppurativa 05/11/2013    Tierney Behl 05/13/2013 PT, MPH  06/25/2021, 5:48 PM  [facility] [address] [place], [place], [place] Phone: [phone]   Fax:  [phone]  Name: Melissa Sheppard MRN: Lyndon Code Date of Birth: [birth year]

## 2021-07-01 ENCOUNTER — Other Ambulatory Visit: Payer: Self-pay

## 2021-07-01 ENCOUNTER — Ambulatory Visit (INDEPENDENT_AMBULATORY_CARE_PROVIDER_SITE_OTHER): Payer: 59 | Admitting: Rehabilitative and Restorative Service Providers"

## 2021-07-01 DIAGNOSIS — R29898 Other symptoms and signs involving the musculoskeletal system: Secondary | ICD-10-CM | POA: Diagnosis not present

## 2021-07-01 DIAGNOSIS — M25511 Pain in right shoulder: Secondary | ICD-10-CM | POA: Diagnosis not present

## 2021-07-01 DIAGNOSIS — R293 Abnormal posture: Secondary | ICD-10-CM | POA: Diagnosis not present

## 2021-07-01 DIAGNOSIS — M6281 Muscle weakness (generalized): Secondary | ICD-10-CM

## 2021-07-01 DIAGNOSIS — G8929 Other chronic pain: Secondary | ICD-10-CM

## 2021-07-01 NOTE — Patient Instructions (Signed)
Access Code: [email or web address]: [email or web address]: 07/06/2022Prepared by: Tarry Fountain HoltExercises  Seated Cervical Retraction - 3 x daily - 7 x weekly - 10 reps - 1 sets  Standing Scapular Retraction - 3 x daily - 7 x weekly - 10 reps - 1 sets - 10 hold  Shoulder External Rotation and Scapular Retraction - 3 x daily - 7 x weekly - 10 reps - 1 sets - hold  Standing Scapular Retraction in Abduction - 3 x daily - 7 x weekly - 10 reps - 1 sets  Doorway Pec Stretch at 60 Degrees Abduction - 3 x daily - 7 x weekly - 3 reps - 1 sets  Doorway Pec Stretch at 90 Degrees Abduction - 3 x daily - 7 x weekly - 3 reps - 1 sets - 30 seconds hold  Doorway Pec Stretch at 120 Degrees Abduction - 3 x daily - 7 x weekly - 3 reps - 1 sets - 30 second hold hold  Prone Scapular Retraction - 2 x daily - 7 x weekly - 1 sets - 5-10 reps - 3-5 sec hold  Prone W Scapular Retraction - 2 x daily - 7 x weekly - 1 sets - 5-10 reps - 3-5 sec hold  Prone Scapular Retraction in Abduction - 2 x daily - 7 x weekly - 1 sets - 5-10 reps - 3-5 sec hold  Prone Scapular Retraction Y - 2 x daily - 7 x weekly - 1 sets - 3-5 reps - 5-10 sec hold  Prone Scapular Retraction in Flexion - 2 x daily - 7 x weekly - 1 sets - 5-10 reps - 3-5 sec hold  Standing Shoulder External Rotation with Resistance - 2 x daily - 7 x weekly - 1-3 sets - 10 reps - 2-3 sec hold  Seated Trunk Rotation Stretch - 2 x daily - 7 x weekly - 1 sets - 3 reps - 30 sec hold  Supine Chest Stretch on Foam Roll - 2 x daily - 7 x weekly - 1 sets - 1 reps - 2-5 min sec hold  Seated Sidebending - 2 x daily - 7 x weekly - 1 sets - 3 reps - 15-20 sec hold  Shoulder Flexion Serratus Activation with Resistance - 1 x daily - 7 x weekly - 1 sets - 10 reps - 3-5 sec hold  Supine Lower Trunk Rotation - 2 x daily - 7 x weekly - 1 sets - 3-5 reps - 20-30 sec hold  Supine Cervical Sidebending - End to Mid Range with Manual Resistance - 2 x daily - 7 x weekly - 1 sets - 3 reps - 30  sec hold

## 2021-07-01 NOTE — Therapy (Signed)
[facility] [address] [place], [place], [address] Phone: [phone]   Fax:  [phone]  Physical Therapy Treatment  Patient Details  Name: Melissa Sheppard MRN: [id number] Date of Birth: December 20, 1991 Referring Provider (PT): Dr Benjamin Stain   Encounter Date: 07/01/2021   PT End of Session - 07/01/21 0723     Visit Number 6    Number of Visits 12    Date for PT Re-Evaluation 07/23/21    PT Start Time 0720    PT Stop Time 0810    PT Time Calculation (min) 50 min    Activity Tolerance Patient tolerated treatment well             Past Medical History:  Diagnosis Date   No pertinent past medical history     Past Surgical History:  Procedure Laterality Date   NO PAST SURGERIES      There were no vitals filed for this visit.   Subjective Assessment - 07/01/21 0724     Subjective DN continues to be helpful. She had some soreness but it did not last long. Working on exercises and ball release work. Went to the gym yesterday and did do some rowing without difficulty.    Currently in Pain? Yes    Pain Score 3     Pain Location Shoulder    Pain Orientation Right;Anterior    Pain Descriptors / Indicators Tightness;Discomfort    Pain Type Chronic pain    Pain Onset More than a month ago    Pain Frequency Intermittent                [facility] PT Assessment - 07/01/21 0001       Assessment   Medical Diagnosis Chronic Rt shoulder pain    Referring Provider (PT) Dr Benjamin Stain    Onset Date/Surgical Date 12/27/20    Hand Dominance Right    Next MD Visit to schedule    Prior Therapy chiropractic care weekly      AROM   Right Shoulder Extension 60 Degrees    Right Shoulder Flexion 156 Degrees    Right Shoulder ABduction 158 Degrees    Right Shoulder Internal Rotation --   thumb T7   Right Shoulder External Rotation 100 Degrees    Left Shoulder Extension 57 Degrees    Left Shoulder Flexion 158  Degrees    Left Shoulder ABduction 158 Degrees    Left Shoulder External Rotation 98 Degrees      Palpation   Palpation comment muscular tightness Rt > Lt ant/lat/post cervical musculatuture; pecs; upper trap; leveator; teres; thoracic paraspinals                           [facility] Adult PT Treatment/Exercise - 07/01/21 0001       Shoulder Exercises: Supine   Other Supine Exercises prolonged snow angel on noodle ~ 2-3 min UE's at ~ 80 deg abd    Other Supine Exercises trunk rotation with stretch for pecs 20 sec x 1 each side      Shoulder Exercises: Standing   Other Standing Exercises activation of serratus x 5      Shoulder Exercises: ROM/Strengthening   UBE (Upper Arm Bike) Level 5 x 4 min alt fwd/back each min      Shoulder Exercises: Stretch   Wall Stretch - ABduction 2 reps;30 seconds    Other Shoulder Stretches 3 position doorway stretch, 30 sec x  2 reps each. arms overhead (hands above door) x 2 reps of 30 sec    Other Shoulder Stretches lateral trunk flexin 20 sec x 2 each side; trunk rotatin seated 15-20 sec x 2 each side; thoracic extension seated 15-20 sec x 2 hands behind head; upper trap stretch Rt UE behind back lateral cervical flexion to Lt 15 sec hold x 3 reps      Moist Heat Therapy   Number Minutes Moist Heat 10 Minutes    Moist Heat Location Shoulder;Cervical      Manual Therapy   Manual Therapy Soft tissue mobilization    Manual therapy comments skilled palpation to assess response to STM and DN    Joint Mobilization cervical PA mobs    Soft tissue mobilization STM upper trap; leveator; cervical musculature; pecs/ant deltoid    Myofascial Release anterior chest/pecs Rt    Passive ROM scaleni stretch middle 20 sec x 2 reps              Trigger Point Dry Needling - 07/01/21 0001     Consent Given? Yes    Education Handout Provided Previously provided    Dry Needling Comments right side    Upper Trapezius Response Twitch reponse  elicited;Palpable increased muscle length    Scalenes Response Palpable increased muscle length    Cervical multifidi Response Palpable increased muscle length    Pectoralis Major Response Palpable increased muscle length    Pectoralis Minor Response Palpable increased muscle length    Deltoid Response Palpable increased muscle length   anterior   Biceps Response Palpable increased muscle length                  PT Education - 07/01/21 0801     Person(s) Educated Patient    Methods Explanation;Demonstration;Tactile cues;Verbal cues;Handout    Comprehension Verbalized understanding;Returned demonstration;Verbal cues required;Tactile cues required                 PT Long Term Goals - 06/11/21 1046       PT LONG TERM GOAL #1   Title Improve posture and alignment with patient to demonstrate improved posture with posterior shoulder girdle musculature engaged    Time 6    Period Weeks    Status New    Target Date 07/23/21      PT LONG TERM GOAL #2   Title Increase bilat shoulder flexion by 5-7 degrees    Time 6    Period Weeks    Status New    Target Date 07/23/21      PT LONG TERM GOAL #3   Title Decrease pain Rt shoulder by 50-75% in frequency, intensity,and/or duration    Time 6    Period Weeks    Status New    Target Date 07/23/21      PT LONG TERM GOAL #4   Title Independent in HEP    Time 6    Period Weeks    Status New    Target Date 07/23/21      PT LONG TERM GOAL #5   Title Improve functional limitation score to 73    Time 6    Period Weeks    Status New    Target Date 07/23/21                   Plan - 07/01/21 0726     Clinical Impression Statement Continued positive resonse to DN and manual work. Patient is working on  exercises at home. Continued muscular tightness through Rt upper quarter. Good response to DN/manual work and stretching. May benefit from continued work on cervical spine    Rehab Potential Good    PT Frequency  2x / week    PT Duration 6 weeks    PT Treatment/Interventions ADLs/Self Care Home Management;Aquatic Therapy;Cryotherapy;Iontophoresis 4mg /ml Dexamethasone;Moist Heat;Ultrasound;Functional mobility training;Therapeutic activities;Therapeutic exercise;Neuromuscular re-education;Patient/family education;Manual techniques;Dry needling;Taping    PT Next Visit Plan Continue posterior shoulder girdle strengthening; manual work/DN; postural correction/education - try lower doorway to stretch biceps; upper trap stretch; scaleni stretch - focus on neck    PT Home Exercise Plan [email or web address]    Consulted and Agree with Plan of Care Patient             Patient will benefit from skilled therapeutic intervention in order to improve the following deficits and impairments:     Visit Diagnosis: Chronic right shoulder pain  Abnormal posture  Other symptoms and signs involving the musculoskeletal system  Muscle weakness (generalized)     Problem List Patient Active Problem List   Diagnosis Date Noted   Right shoulder pain 06/09/2021   Hidradenitis suppurativa 05/11/2013    Melissa Sheppard 05/13/2013 PT, MPH  07/01/2021, 8:09 AM  [facility] [address] [place], [place], [place] Phone: [phone]   Fax:  [phone]  Name: Melissa Sheppard MRN: Lyndon Code Date of Birth: [birth year]

## 2021-07-02 ENCOUNTER — Encounter: Payer: 59 | Admitting: Rehabilitative and Restorative Service Providers"

## 2021-07-03 ENCOUNTER — Other Ambulatory Visit: Payer: Self-pay

## 2021-07-03 ENCOUNTER — Encounter: Payer: Self-pay | Admitting: Physical Therapy

## 2021-07-03 ENCOUNTER — Ambulatory Visit (INDEPENDENT_AMBULATORY_CARE_PROVIDER_SITE_OTHER): Payer: 59 | Admitting: Physical Therapy

## 2021-07-03 DIAGNOSIS — R293 Abnormal posture: Secondary | ICD-10-CM | POA: Diagnosis not present

## 2021-07-03 DIAGNOSIS — R29898 Other symptoms and signs involving the musculoskeletal system: Secondary | ICD-10-CM

## 2021-07-03 DIAGNOSIS — M6281 Muscle weakness (generalized): Secondary | ICD-10-CM | POA: Diagnosis not present

## 2021-07-03 DIAGNOSIS — M25511 Pain in right shoulder: Secondary | ICD-10-CM

## 2021-07-03 DIAGNOSIS — G8929 Other chronic pain: Secondary | ICD-10-CM

## 2021-07-03 NOTE — Therapy (Signed)
[place], [place], [address] Phone: [phone]   Fax:  [phone]  Physical Therapy Treatment  Patient Details  Name: Melissa Sheppard MRN: [id number] Date of Birth: 1991/09/25 Referring Provider (PT): Dr Dianah Field   Encounter Date: 07/03/2021   PT End of Session - 07/03/21 0842     Visit Number 7    Number of Visits 12    Date for PT Re-Evaluation 07/23/21    PT Start Time 0803    PT Stop Time 0843    PT Time Calculation (min) 40 min    Activity Tolerance Patient tolerated treatment well    Behavior During Therapy [facility] for tasks assessed/performed             Past Medical History:  Diagnosis Date   No pertinent past medical history     Past Surgical History:  Procedure Laterality Date   NO PAST SURGERIES      There were no vitals filed for this visit.   Subjective Assessment - 07/03/21 0806     Subjective Pt reports her Rt shoulder "feels more stable" and her pain is more intermittent.  She had 8/10 pain in post right shoulder with sitting at birthday party for 1.5 hr; resolved with heat and stretching afterwards. She hasn't tried any punching moves at gym yet.    Pertinent History arthritis Rt knee; chronic Rt shoulder pain; anxiety    Patient Stated Goals get rid of pain and learn HEP    Currently in Pain? No/denies    Pain Score 0-No pain                [facility] PT Assessment - 07/03/21 0001       Assessment   Medical Diagnosis Chronic Rt shoulder pain    Referring Provider (PT) Dr Dianah Field    Onset Date/Surgical Date 12/27/20    Hand Dominance Right    Next MD Visit to schedule    Prior Therapy chiropractic care weekly               [facility] Adult PT Treatment/Exercise - 07/03/21 0001       Shoulder Exercises: Supine   Other Supine Exercises lower trunk rotation with leg swings, arms in T x 8 each side.      Shoulder Exercises: Prone   Other  Prone Exercises prone series with 2# in each hand: T's x 10 (trial of 3# x 3 reps - too heavy), W's x 10 with 2#, I's with 1# x 5 (unilateral), Y's x 8 reps with 1#. 2 sets.    Other Prone Exercises cat/cow, childs pose x 4 (in between prone series exercises).  Thread the needle and reach for ceiling x 5 reps each side.      Shoulder Exercises: ROM/Strengthening   UBE (Upper Arm Bike) L5: 4 min, alternating directions, in standing.      Shoulder Exercises: Stretch   Other Shoulder Stretches 3 position doorway stretch, 30 sec x 2 reps each. arms overhead (hands above door) x 2 reps of 30 sec, bilat bicep stretch at door frame x 20 sec x 2. Rt upper trap stretch x 15 sec               PT Long Term Goals - 07/03/21 0837       PT LONG TERM GOAL #1   Title Improve posture and alignment with patient to demonstrate improved posture with posterior shoulder girdle musculature engaged  Time 6    Period Weeks    Status On-going      PT LONG TERM GOAL #2   Title Increase bilat shoulder flexion by 5-7 degrees    Time 6    Period Weeks    Status Achieved      PT LONG TERM GOAL #3   Title Decrease pain Rt shoulder by 50-75% in frequency, intensity,and/or duration    Time 6    Period Weeks    Status Partially Met      PT LONG TERM GOAL #4   Title Independent in HEP    Time 6    Period Weeks    Status On-going      PT LONG TERM GOAL #5   Title Improve functional limitation score to 73    Time 6    Period Weeks    Status On-going                   Plan - 07/03/21 4715     Clinical Impression Statement Pt tolerated added resistance with prone series, without increased symptoms or pain.  Minor cues to avoid compensation with upper traps. She reported reduction of Rt oblique tightness after today's stretches. Overall making great progress towards LTGs. Pt verbalized possible readiness to d/c after next weeks visits. She will trial kickboxing over weekend.    Rehab Potential  Good    PT Frequency 2x / week    PT Duration 6 weeks    PT Treatment/Interventions ADLs/Self Care Home Management;Aquatic Therapy;Cryotherapy;Iontophoresis 47m/ml Dexamethasone;Moist Heat;Ultrasound;Functional mobility training;Therapeutic activities;Therapeutic exercise;Neuromuscular re-education;Patient/family education;Manual techniques;Dry needling;Taping    PT Next Visit Plan Continue posterior shoulder girdle strengthening; manual work/DN; postural correction/education - focus on neck. finalize HEP; FOTO.    PT Home Exercise Plan [email or web address]    Consulted and Agree with Plan of Care Patient             Patient will benefit from skilled therapeutic intervention in order to improve the following deficits and impairments:  Decreased range of motion, Decreased activity tolerance, Pain, Hypomobility, Impaired flexibility, Improper body mechanics, Decreased mobility, Decreased strength, Postural dysfunction  Visit Diagnosis: Chronic right shoulder pain  Abnormal posture  Other symptoms and signs involving the musculoskeletal system  Muscle weakness (generalized)     Problem List Patient Active Problem List   Diagnosis Date Noted   Right shoulder pain 06/09/2021   Hidradenitis suppurativa 05/11/2013   JKerin Perna PTA 07/03/21 8:50 AM   [place] [address]Phone: [phone]  Fax:  [phone] Name: Melissa RonningMRN: [id number] of Birth: 7April 08, 1992

## 2021-07-06 ENCOUNTER — Encounter: Payer: Self-pay | Admitting: Rehabilitative and Restorative Service Providers"

## 2021-07-06 ENCOUNTER — Encounter: Payer: Self-pay | Admitting: Medical-Surgical

## 2021-07-06 ENCOUNTER — Other Ambulatory Visit: Payer: Self-pay

## 2021-07-06 ENCOUNTER — Ambulatory Visit (INDEPENDENT_AMBULATORY_CARE_PROVIDER_SITE_OTHER): Payer: 59 | Admitting: Rehabilitative and Restorative Service Providers"

## 2021-07-06 DIAGNOSIS — R29898 Other symptoms and signs involving the musculoskeletal system: Secondary | ICD-10-CM | POA: Diagnosis not present

## 2021-07-06 DIAGNOSIS — M25511 Pain in right shoulder: Secondary | ICD-10-CM

## 2021-07-06 DIAGNOSIS — G8929 Other chronic pain: Secondary | ICD-10-CM

## 2021-07-06 DIAGNOSIS — M6281 Muscle weakness (generalized): Secondary | ICD-10-CM | POA: Diagnosis not present

## 2021-07-06 DIAGNOSIS — R293 Abnormal posture: Secondary | ICD-10-CM | POA: Diagnosis not present

## 2021-07-06 NOTE — Therapy (Signed)
[place], [place], [address] Phone: [phone]   Fax:  [phone]  Physical Therapy Treatment  Patient Details  Name: Melissa Sheppard MRN: [id number] Date of Birth: September 07, 1991 Referring Provider (PT): Dr Dianah Field   Encounter Date: 07/06/2021   PT End of Session - 07/06/21 0718     Visit Number 8    Number of Visits 12    Date for PT Re-Evaluation 07/23/21    PT Start Time 0716    PT Stop Time 0805    PT Time Calculation (min) 49 min    Activity Tolerance Patient tolerated treatment well             Past Medical History:  Diagnosis Date   No pertinent past medical history     Past Surgical History:  Procedure Laterality Date   NO PAST SURGERIES      There were no vitals filed for this visit.   Subjective Assessment - 07/06/21 0719     Subjective Patient reports that she has been a lot of stretching over the weekend. Has not done the weights over the weekend. Has had numbness in the lateral Rt calf/shin and tingling in the bottom of the foot since yesterday. Does not know of any reason she should have numbness. She has not had this numbness in the past.    Currently in Pain? Yes    Pain Score 2     Pain Location Shoulder    Pain Orientation Right;Anterior    Pain Descriptors / Indicators Tightness;Discomfort    Pain Type Chronic pain                [facility] PT Assessment - 07/06/21 0001       Assessment   Medical Diagnosis Chronic Rt shoulder pain    Referring Provider (PT) Dr Dianah Field    Onset Date/Surgical Date 12/27/20    Hand Dominance Right    Next MD Visit to schedule    Prior Therapy chiropractic care weekly      AROM   Overall AROM Comments tightness lumbar extension; end range hip rotation Lt > Rt      Strength   Overall Strength --   WFL's bilat ankles pt with sensation of weakness with eversion but no change in resistive testing      Flexibility   Soft Tissue Assessment /Muscle Length --   tenderness to palpation fibular head Rt     Palpation   Palpation comment muscular tightness Rt > Lt ant/lat/post cervical musculatuture; pecs; upper trap; leveator; teres; thoracic paraspinals                           [facility] Adult PT Treatment/Exercise - 07/06/21 0001       Shoulder Exercises: Prone   Other Prone Exercises prone series T's x 10, W's x 10, I's, Y's x 5 reps    Other Prone Exercises prone press up x 10 reeps to pt tolerance keeping hips on surface      Shoulder Exercises: ROM/Strengthening   UBE (Upper Arm Bike) L5: 4 min, alternating directions, in standing.      Shoulder Exercises: Stretch   Other Shoulder Stretches 3 position doorway stretch, 30 sec x 2 reps each. arms overhead (hands above door) x 2 reps of 30 sec      Moist Heat Therapy   Number Minutes Moist Heat 10 Minutes    Moist  Heat Location Shoulder;Cervical      Manual Therapy   Manual Therapy Soft tissue mobilization    Manual therapy comments skilled palpation to assess response to STM and DN    Joint Mobilization cervical PA mobs    Soft tissue mobilization STM upper trap; leveator; cervical musculature; pecs/ant deltoid    Passive ROM scaleni stretch middle 20 sec x 2 reps              Trigger Point Dry Needling - 07/06/21 0001     Consent Given? Yes    Education Handout Provided Previously provided    Dry Needling Comments right side    Upper Trapezius Response Twitch reponse elicited;Palpable increased muscle length    Suboccipitals Response Palpable increased muscle length    Scalenes Response Palpable increased muscle length    Cervical multifidi Response Palpable increased muscle length                       PT Long Term Goals - 07/03/21 0837       PT LONG TERM GOAL #1   Title Improve posture and alignment with patient to demonstrate improved posture with posterior shoulder girdle  musculature engaged    Time 6    Period Weeks    Status On-going      PT LONG TERM GOAL #2   Title Increase bilat shoulder flexion by 5-7 degrees    Time 6    Period Weeks    Status Achieved      PT LONG TERM GOAL #3   Title Decrease pain Rt shoulder by 50-75% in frequency, intensity,and/or duration    Time 6    Period Weeks    Status Partially Met      PT LONG TERM GOAL #4   Title Independent in HEP    Time 6    Period Weeks    Status On-going      PT LONG TERM GOAL #5   Title Improve functional limitation score to 73    Time 6    Period Weeks    Status On-going                   Plan - 07/06/21 0017     Clinical Impression Statement Patient presents today reporting that neck and shoulder areas are "pretty good". She now has numbness in the lateral Rt calf which has been present for the past 24 hours. Evaluation on Lumbar spine is unremarkable with exception of tightness in lumbar extension. Suggested paitent contact MD is numbness in Rt lateral calf does not subside. Note continued tightness in the cervical musculature Rt > Lt.    Rehab Potential Good    PT Frequency 2x / week    PT Duration 6 weeks    PT Treatment/Interventions ADLs/Self Care Home Management;Aquatic Therapy;Cryotherapy;Iontophoresis 21m/ml Dexamethasone;Moist Heat;Ultrasound;Functional mobility training;Therapeutic activities;Therapeutic exercise;Neuromuscular re-education;Patient/family education;Manual techniques;Dry needling;Taping    PT Next Visit Plan Continue posterior shoulder girdle strengthening; manual work/DN; postural correction/education - focus on neck. finalize HEP; FOTO.    PT Home Exercise Plan [id number]    Consulted and Agree with Plan of Care Patient             Patient will benefit from skilled therapeutic intervention in order to improve the following deficits and impairments:     Visit Diagnosis: Chronic right shoulder pain  Abnormal posture  Other symptoms and  signs involving the musculoskeletal system  Muscle weakness (generalized)  Problem List Patient Active Problem List   Diagnosis Date Noted   Right shoulder pain 06/09/2021   Hidradenitis suppurativa 05/11/2013    Jevon Shells Nilda Simmer PT, MPH  07/06/2021, 8:01 AM  [facility] [place], [place], [address] Phone: [phone]   Fax:  [phone]  Name: Melissa Sheppard MRN: [id number] Date of Birth: September 09, 1991

## 2021-07-07 ENCOUNTER — Ambulatory Visit: Payer: 59 | Admitting: Medical-Surgical

## 2021-07-08 ENCOUNTER — Other Ambulatory Visit: Payer: Self-pay

## 2021-07-08 ENCOUNTER — Encounter: Payer: Self-pay | Admitting: Medical-Surgical

## 2021-07-08 ENCOUNTER — Ambulatory Visit (INDEPENDENT_AMBULATORY_CARE_PROVIDER_SITE_OTHER): Payer: 59 | Admitting: Rehabilitative and Restorative Service Providers"

## 2021-07-08 ENCOUNTER — Ambulatory Visit (INDEPENDENT_AMBULATORY_CARE_PROVIDER_SITE_OTHER): Payer: 59 | Admitting: Medical-Surgical

## 2021-07-08 ENCOUNTER — Ambulatory Visit (INDEPENDENT_AMBULATORY_CARE_PROVIDER_SITE_OTHER): Payer: 59

## 2021-07-08 ENCOUNTER — Encounter: Payer: Self-pay | Admitting: Rehabilitative and Restorative Service Providers"

## 2021-07-08 VITALS — BP 113/72 | HR 80 | Temp 98.5°F | Ht 68.0 in | Wt 167.0 lb

## 2021-07-08 DIAGNOSIS — R200 Anesthesia of skin: Secondary | ICD-10-CM | POA: Diagnosis not present

## 2021-07-08 DIAGNOSIS — M6281 Muscle weakness (generalized): Secondary | ICD-10-CM | POA: Diagnosis not present

## 2021-07-08 DIAGNOSIS — R29898 Other symptoms and signs involving the musculoskeletal system: Secondary | ICD-10-CM | POA: Diagnosis not present

## 2021-07-08 DIAGNOSIS — R202 Paresthesia of skin: Secondary | ICD-10-CM

## 2021-07-08 DIAGNOSIS — R293 Abnormal posture: Secondary | ICD-10-CM

## 2021-07-08 DIAGNOSIS — G8929 Other chronic pain: Secondary | ICD-10-CM

## 2021-07-08 DIAGNOSIS — M25511 Pain in right shoulder: Secondary | ICD-10-CM | POA: Diagnosis not present

## 2021-07-08 MED ORDER — PREDNISONE 50 MG PO TABS: 50.0000 mg | ORAL_TABLET | Freq: Every day | ORAL | 0 refills | Status: DC

## 2021-07-08 NOTE — Progress Notes (Signed)
Subjective:    CC: Right lower extremity numbness/tingling  HPI: Pleasant 30 year old female presenting today for evaluation of right lower extremity numbness and tingling this been present for the last couple of days.  She has not had any recent changes in her and numb from the anterior/lateral knee extending down the lateral right calf and across the top of her foot, involving digits 1 through 3.  Her symptoms are somewhat better today but she still has the numbness and altered sensation.  At onset, she had some alteration in motor function but she relates this more to being unable to feel the ground contact engage her movements.  No alterations in gait, skin color, or temperature.  I reviewed the past medical history, family history, social history, surgical history, and allergies today and no changes were needed.  Please see the problem list section below in epic for further details.  Past Medical History: Past Medical History:  Diagnosis Date   No pertinent past medical history    Past Surgical History: Past Surgical History:  Procedure Laterality Date   NO PAST SURGERIES     Social History: Social History   Socioeconomic History   Marital status: Married    Spouse name: Not on file   Number of children: Not on file   Years of education: Not on file   Highest education level: Not on file  Occupational History   Not on file  Tobacco Use   Smoking status: Never   Smokeless tobacco: Never  Substance and Sexual Activity   Alcohol use: Yes    Comment: occasionally   Drug use: Never   Sexual activity: Yes    Birth control/protection: Implant  Other Topics Concern   Not on file  Social History Narrative   Not on file   Social Determinants of Health   Financial Resource Strain: Not on file  Food Insecurity: Not on file  Transportation Needs: Not on file  Physical Activity: Not on file  Stress: Not on file  Social Connections: Not on file   Family History: Family  History  Problem Relation Age of Onset   Stroke Mother    Diabetes Father    Hypertension Other    Breast cancer Cousin    Allergies: Allergies  Allergen Reactions   Amoxicillin Rash   Medications: See med rec.  Review of Systems: See HPI for pertinent positives and negatives.   Objective:    General: Well Developed, well nourished, and in no acute distress.  Neuro: Alert and oriented x3.  HEENT: Normocephalic, atraumatic.  Skin: Warm and dry. Cardiac: Regular rate and rhythm, no murmurs rubs or gallops, no lower extremity edema.  Respiratory: Clear to auscultation bilaterally. Not using accessory muscles, speaking in full sentences. RLE: mild alteration of sensation along the lateral lower leg extending across the top of the foot.  Neurovascularly intact.  Range of motion intact to all joints and muscle strength 5/5.  Impression and Recommendations:    1. Numbness and tingling of right lower extremity Unclear etiology.  Consider L5-S1 nerve compression or possibly cutaneous nerve entrapment.  We will do a short burst of prednisone 50 mg daily x5 days.  During this time, stop the meloxicam.  Once prednisone burst is completed, okay to restart meloxicam.  Continue chiropractic care and physical therapy as directed. - DG Lumbar Spine Complete; Future  Return if symptoms worsen or fail to improve. ___________________________________________ Thayer Ohm, DNP, APRN, FNP-BC Primary Care and Sports Medicine [facility]  [place] Ser

## 2021-07-08 NOTE — Therapy (Addendum)
Melissa Sheppard Melissa [place]pp[place], [place], [address] Phone: [phone]   Fax:  [phone]  Physical Therapy Treatment  Patient Details  Name: Melissa Sheppard MRN: [id number] Date of Birth: April 04, 1991 Referring Provider (PT): Dr Dianah Field   Encounter Date: 07/08/2021   PT End of Session - 07/08/21 0725     Visit Number 9    Number of Visits 12    Date for PT Re-Evaluation 07/23/21    PT Start Time 0723    PT Stop Time 0807   MH end of treatment   PT Time Calculation (min) 44 min    Activity Tolerance Patient tolerated treatment well             Past Medical History:  Diagnosis Date   No pertinent past medical history     Past Surgical History:  Procedure Laterality Date   NO PAST SURGERIES      There were no vitals filed for this visit.   Subjective Assessment - 07/08/21 0726     Subjective Patient reports that she is worried about the numbness in the Rt lateral leg which has not improved significantly   She feels there is some weakness in the calf now as well. She will see Melissa Bouche, PA-C this morning at 10 for evaluation of the numbness and tingling in the Rt LE.  Neck and shoulder are tired and sore in upper trap, biceps, deltoid area. The DN for the neck area seemed to do OK.    Currently in Pain? Yes    Pain Score 3     Pain Location Neck    Pain Orientation Right;Anterior    Pain Descriptors / Indicators Tightness;Discomfort    Pain Type Chronic pain                               [facility] Adult PT Treatment/Exercise - 07/08/21 0001       Shoulder Exercises: Prone   Other Prone Exercises prone series T's x 10, W's x 10, I's, Y's x 5 reps      Shoulder Exercises: ROM/Strengthening   UBE (Upper Arm Bike) L5: 4 min, alternating directions, in standing.      Shoulder Exercises: Stretch   Other Shoulder Stretches 3 position doorway stretch, 30 sec x 2 reps each. arms  overhead (hands above door) x 2 reps of 30 sec    Other Shoulder Stretches upper trap stretch with hand behind head 15 sec x 3 reps      Moist Heat Therapy   Number Minutes Moist Heat 10 Minutes    Moist Heat Location Shoulder;Cervical      Manual Therapy   Manual Therapy Soft tissue mobilization    Manual therapy comments skilled palpation to assess response to STM and DN    Joint Mobilization cervical PA mobs    Soft tissue mobilization STM upper trap; leveator; cervical musculature; pecs/ant deltoid              Trigger Point Dry Needling - 07/08/21 0001     Consent Given? Yes    Education Handout Provided Previously provided    Dry Needling Comments right side    Upper Trapezius Response Twitch reponse elicited;Palpable increased muscle length    Cervical multifidi Response Palpable increased muscle length  PT Long Term Goals - 07/03/21 0837       PT LONG TERM GOAL #1   Title Improve posture and alignment with patient to demonstrate improved posture with posterior shoulder girdle musculature engaged    Time 6    Period Weeks    Status On-going      PT LONG TERM GOAL #2   Title Increase bilat shoulder flexion by 5-7 degrees    Time 6    Period Weeks    Status Achieved      PT LONG TERM GOAL #3   Title Decrease pain Rt shoulder by 50-75% in frequency, intensity,and/or duration    Time 6    Period Weeks    Status Partially Met      PT LONG TERM GOAL #4   Title Independent in HEP    Time 6    Period Weeks    Status On-going      PT LONG TERM GOAL #5   Title Improve functional limitation score to 73    Time 6    Period Weeks    Status On-going                   Plan - 07/08/21 0729     Clinical Impression Statement Continued numbness and tingling in the Rt LE. Strength in Rt LE is WFL's for testing but pt reports sensation of weakness. Rt cervical spine and shoulder girdle musculature continues to be tight to  palpation. Patient is working on stretching and strengthening exercises for neck and shoulders consistently.    Rehab Potential Good    PT Frequency 2x / week    PT Duration 6 weeks    PT Treatment/Interventions ADLs/Self Care Home Management;Aquatic Therapy;Cryotherapy;Iontophoresis 5m/ml Dexamethasone;Moist Heat;Ultrasound;Functional mobility training;Therapeutic activities;Therapeutic exercise;Neuromuscular re-education;Patient/family education;Manual techniques;Dry needling;Taping    PT Next Visit Plan Continue posterior shoulder girdle strengthening; manual work/DN; postural correction/education - focus on neck. finalize HEP; FOTO.    PT Home Exercise Plan [id number]    HTMBPJPETand Agree with Plan of Care Patient             Patient will benefit from skilled therapeutic intervention in order to improve the following deficits and impairments:     Visit Diagnosis: Chronic right shoulder pain  Abnormal posture  Other symptoms and signs involving the musculoskeletal system  Muscle weakness (generalized)     Problem List Patient Active Problem List   Diagnosis Date Noted   Right shoulder pain 06/09/2021   Hidradenitis suppurativa 05/11/2013    Melissa Sheppard PNilda SimmerPT, MPH  07/08/2021, 7:59 AM  [facility] [address]Phone: [phone]  Fax:  [phone] Name: Melissa CorredorMRN: [id number] of Birth: 7September 09, 1992  PHYSICAL THERAPY DISCHARGE SUMMARY  Visits from Start of Care: 9  Current functional level related to goals / functional outcomes: See progress note for discharge status    Remaining deficits: Unknown    Education / Equipment: HEP   Patient agrees to discharge. Patient goals were partially met. Patient is being discharged due to not returning since the last visit.  Melissa Sheppard P. HHelene KelpPT, MPH 08/05/21 8:28 AM

## 2021-07-29 ENCOUNTER — Ambulatory Visit (INDEPENDENT_AMBULATORY_CARE_PROVIDER_SITE_OTHER): Payer: 59 | Admitting: Licensed Clinical Social Worker

## 2021-07-29 DIAGNOSIS — F411 Generalized anxiety disorder: Secondary | ICD-10-CM | POA: Diagnosis not present
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
## 2021-07-31 ENCOUNTER — Ambulatory Visit (HOSPITAL_COMMUNITY): Payer: Self-pay | Admitting: Licensed Clinical Social Worker

## 2021-08-18 ENCOUNTER — Ambulatory Visit (INDEPENDENT_AMBULATORY_CARE_PROVIDER_SITE_OTHER): Payer: 59 | Admitting: Licensed Clinical Social Worker

## 2021-08-18 DIAGNOSIS — F411 Generalized anxiety disorder: Secondary | ICD-10-CM

## 2021-08-18 NOTE — Progress Notes (Signed)
Virtual Visit via Video Note  I connected with Melissa Sheppard on 08/18/21 at  8:00 AM EDT by a video enabled telemedicine application and verified that I am speaking with the correct person using two identifiers.  Location: Patient: stationary car Provider: home office   I discussed the limitations of evaluation and management by telemedicine and the availability of in person appointments. The patient expressed understanding and agreed to proceed.   I discussed the assessment and treatment plan with the patient. The patient was provided an opportunity to ask questions and all were answered. The patient agreed with the plan and demonstrated an understanding of the instructions.   The patient was advised to call back or seek an in-person evaluation if the symptoms worsen or if the condition fails to improve as anticipated.  I provided 54 minutes of non-face-to-face time during this encounter.  THERAPIST PROGRESS NOTE  Session Time: 8:00 AM to 8:54 AM  Participation Level: Active  Behavioral Response: CasualAlertEuthymic  Type of Therapy: Individual Therapy  Treatment Goals addressed:  Processed through feelings related to relationship with mom including letting go of expectations, managing not having any expectations, trusting herself, decrease in anxiety, coping Interventions: Solution Focused, Strength-based, Supportive, and Other: Completing paperwork that was unable to be completed last session.  Summary: Christen Wardrop is a 30 y.o. female who presents with this session because last session was started late had a complete assessment, pain evaluation, nutrition evaluation, complete treatment plan.  Noted helpful as patient gave a lot more detail and answering questions that we will help in working on issues.  Also completed treatment to plan and patient gave consent to complete virtually.  Noted patient needing to work on letting go of expectations and relationship with mom  and learning how to do that or lack of expectations.  Also trusting herself more and strategies to work on anxiety.  Therapist provided active listening open questions, supportive interventions..   Suicidal/Homicidal: No  Plan: Return again in 2 weeks.2.  Look at CBT anxiety, look at interpersonal relationships from trauma book  Diagnosis: Axis I:  generalized anxiety disorder    Axis II: No diagnosis    Coolidge Breeze, LCSW 08/18/2021

## 2021-09-01 ENCOUNTER — Ambulatory Visit (INDEPENDENT_AMBULATORY_CARE_PROVIDER_SITE_OTHER): Payer: 59 | Admitting: Licensed Clinical Social Worker

## 2021-09-01 DIAGNOSIS — F411 Generalized anxiety disorder: Secondary | ICD-10-CM
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
## 2021-09-15 ENCOUNTER — Ambulatory Visit (HOSPITAL_COMMUNITY): Payer: 59 | Admitting: Licensed Clinical Social Worker

## 2021-09-15 NOTE — Progress Notes (Signed)
Therapist contacted patient for session through My Chart and she did not respond. Session is a no show.

## 2021-11-03 ENCOUNTER — Other Ambulatory Visit: Payer: Self-pay | Admitting: Sports Medicine

## 2021-11-03 DIAGNOSIS — G8929 Other chronic pain: Secondary | ICD-10-CM

## 2021-12-03 ENCOUNTER — Telehealth: Payer: 59 | Admitting: Physician Assistant

## 2021-12-03 DIAGNOSIS — U071 COVID-19: Secondary | ICD-10-CM

## 2021-12-03 MED ORDER — NIRMATRELVIR/RITONAVIR (PAXLOVID)TABLET: 3.0000 | ORAL_TABLET | Freq: Two times a day (BID) | ORAL | 0 refills | Status: AC

## 2021-12-03 MED ORDER — ONDANSETRON HCL 4 MG PO TABS: 4.0000 mg | ORAL_TABLET | Freq: Three times a day (TID) | ORAL | 0 refills | Status: DC | PRN

## 2021-12-03 NOTE — Progress Notes (Signed)
Virtual Visit Consent   Melissa Sheppard, you are scheduled for a virtual visit with a [facility] provider today.     Just as with appointments in the office, your consent must be obtained to participate.  Your consent will be active for this visit and any virtual visit you may have with one of our providers in the next 365 days.     If you have a MyChart account, a copy of this consent can be sent to you electronically.  All virtual visits are billed to your insurance company just like a traditional visit in the office.    As this is a virtual visit, video technology does not allow for your provider to perform a traditional examination.  This may limit your provider's ability to fully assess your condition.  If your provider identifies any concerns that need to be evaluated in person or the need to arrange testing (such as labs, EKG, etc.), we will make arrangements to do so.     Although advances in technology are sophisticated, we cannot ensure that it will always work on either your end or our end.  If the connection with a video visit is poor, the visit may have to be switched to a telephone visit.  With either a video or telephone visit, we are not always able to ensure that we have a secure connection.     I need to obtain your verbal consent now.   Are you willing to proceed with your visit today?    Naeemah Xiomara Sevillano has provided verbal consent on 12/03/2021 for a virtual visit (video or telephone).   Margaretann Loveless, PA-C   Date: 12/03/2021 5:16 PM   Virtual Visit via Video Note   I, Margaretann Loveless, connected with  Melissa Sheppard  ([id number], December 17, 1991) on 12/03/21 at  5:00 PM EST by a video-enabled telemedicine application and verified that I am speaking with the correct person using two identifiers.  Location: Patient: Virtual Visit Location Patient: Home Provider: Virtual Visit Location Provider: Home Office   I discussed the limitations of evaluation and  management by telemedicine and the availability of in person appointments. The patient expressed understanding and agreed to proceed.    History of Present Illness: Melissa Sheppard is a 30 y.o. who identifies as a female who was assigned female at birth, and is being seen today for Covid 68.  HPI: URI  This is a new problem. Episode onset: Tested positive today for Covid 19; symptoms started yesterday. The problem has been gradually worsening. The maximum temperature recorded prior to her arrival was 100.4 - 100.9 F. The fever has been present for Less than 1 day. Associated symptoms include congestion, coughing, headaches, nausea, rhinorrhea, sinus pain and a sore throat. Pertinent negatives include no diarrhea, ear pain, plugged ear sensation, vomiting or wheezing. Associated symptoms comments: Body aches, fatigue. Treatments tried: dayquil, nyquil, emergen-C. The treatment provided mild relief.     Problems:  Patient Active Problem List   Diagnosis Date Noted   Right shoulder pain 06/09/2021   Hidradenitis suppurativa 05/11/2013    Allergies:  Allergies  Allergen Reactions   Amoxicillin Rash   Medications:  Current Outpatient Medications:    nirmatrelvir/ritonavir EUA (PAXLOVID) 20 x 150 MG & 10 x 100MG  TABS, Take 3 tablets by mouth 2 (two) times daily for 5 days. (Take nirmatrelvir 150 mg two tablets twice daily for 5 days and ritonavir 100 mg one tablet twice daily for 5  days) Pa2tient GFR is 76, Disp: 30 tablet, Rfl: 0   ondansetron (ZOFRAN) 4 MG tablet, Take 1 tablet (4 mg total) by mouth every 8 (eight) hours as needed for nausea or vomiting., Disp: 20 tablet, Rfl: 0   clindamycin (CLEOCIN T) 1 % SWAB, Apply 1 application topically 2 (two) times daily., Disp: 60 each, Rfl: 1   etonogestrel (NEXPLANON) 68 MG IMPL implant, Inject 1 Device into the skin once. Placed 01/2019, Disp: , Rfl:    meloxicam (MOBIC) 15 MG tablet, One tab PO qAM with a meal for 2 weeks, then daily prn pain.,  Disp: 30 tablet, Rfl: 3   predniSONE (DELTASONE) 50 MG tablet, Take 1 tablet (50 mg total) by mouth daily., Disp: 5 tablet, Rfl: 0  Observations/Objective: Patient is well-developed, well-nourished in no acute distress.  Resting comfortably at home.  Head is normocephalic, atraumatic.  No labored breathing. Speech is clear and coherent with logical content.  Patient is alert and oriented at baseline.    Assessment and Plan: 1. COVID-19 - MyChart COVID-19 home monitoring program; Future - nirmatrelvir/ritonavir EUA (PAXLOVID) 20 x 150 MG & 10 x 100MG  TABS; Take 3 tablets by mouth 2 (two) times daily for 5 days. (Take nirmatrelvir 150 mg two tablets twice daily for 5 days and ritonavir 100 mg one tablet twice daily for 5 days) Pa2tient GFR is 76  Dispense: 30 tablet; Refill: 0 - ondansetron (ZOFRAN) 4 MG tablet; Take 1 tablet (4 mg total) by mouth every 8 (eight) hours as needed for nausea or vomiting.  Dispense: 20 tablet; Refill: 0  - Continue OTC symptomatic management of choice - Will send OTC vitamins and supplement information through AVS - Paxlovid and zofran prescribed - Patient enrolled in MyChart symptom monitoring - Push fluids - Rest as needed - Discussed return precautions and when to seek in-person evaluation, sent via AVS as well  Follow Up Instructions: I discussed the assessment and treatment plan with the patient. The patient was provided an opportunity to ask questions and all were answered. The patient agreed with the plan and demonstrated an understanding of the instructions.  A copy of instructions were sent to the patient via MyChart unless otherwise noted below.    The patient was advised to call back or seek an in-person evaluation if the symptoms worsen or if the condition fails to improve as anticipated.  Time:  I spent 15 minutes with the patient via telehealth technology discussing the above problems/concerns.    , PA-C

## 2021-12-03 NOTE — Patient Instructions (Signed)
Lyndon Code, thank you for joining Margaretann Loveless, PA-C for today's virtual visit.  While this provider is not your primary care provider (PCP), if your PCP is located in our provider database this encounter information will be shared with them immediately following your visit.  Consent: (Patient) Melissa Sheppard provided verbal consent for this virtual visit at the beginning of the encounter.  Current Medications:  Current Outpatient Medications:    nirmatrelvir/ritonavir EUA (PAXLOVID) 20 x 150 MG & 10 x 100MG  TABS, Take 3 tablets by mouth 2 (two) times daily for 5 days. (Take nirmatrelvir 150 mg two tablets twice daily for 5 days and ritonavir 100 mg one tablet twice daily for 5 days) Pa2tient GFR is 76, Disp: 30 tablet, Rfl: 0   ondansetron (ZOFRAN) 4 MG tablet, Take 1 tablet (4 mg total) by mouth every 8 (eight) hours as needed for nausea or vomiting., Disp: 20 tablet, Rfl: 0   clindamycin (CLEOCIN T) 1 % SWAB, Apply 1 application topically 2 (two) times daily., Disp: 60 each, Rfl: 1   etonogestrel (NEXPLANON) 68 MG IMPL implant, Inject 1 Device into the skin once. Placed 01/2019, Disp: , Rfl:    meloxicam (MOBIC) 15 MG tablet, One tab PO qAM with a meal for 2 weeks, then daily prn pain., Disp: 30 tablet, Rfl: 3   predniSONE (DELTASONE) 50 MG tablet, Take 1 tablet (50 mg total) by mouth daily., Disp: 5 tablet, Rfl: 0   Medications ordered in this encounter:  Meds ordered this encounter  Medications   nirmatrelvir/ritonavir EUA (PAXLOVID) 20 x 150 MG & 10 x 100MG  TABS    Sig: Take 3 tablets by mouth 2 (two) times daily for 5 days. (Take nirmatrelvir 150 mg two tablets twice daily for 5 days and ritonavir 100 mg one tablet twice daily for 5 days) Pa2tient GFR is 76    Dispense:  30 tablet    Refill:  0    Order Specific Question:   Supervising Provider    Answer:   02/2019, BRIAN [[id number]]   ondansetron (ZOFRAN) 4 MG tablet    Sig: Take 1 tablet (4 mg total) by mouth every 8  (eight) hours as needed for nausea or vomiting.    Dispense:  20 tablet    Refill:  0    Order Specific Question:   Supervising Provider    Answer:   , BRIAN [[id number]]     *If you need refills on other medications prior to your next appointment, please contact your pharmacy*  Follow-Up: Call back or seek an in-person evaluation if the symptoms worsen or if the condition fails to improve as anticipated.  Other Instructions (Paxlovid) Nirmatrelvir; Ritonavir Tablets What is this medication? NIRMATRELVIR; RITONAVIR (NIR ma TREL vir; ri TOE na veer) treats mild to moderate COVID-19. It may help people who are at high risk of developing severe illness. This medication works by limiting the spread of the virus in your body. The FDA has allowed the emergency use of this medication. This medicine may be used for other purposes; ask your health care provider or pharmacist if you have questions. COMMON BRAND NAME(S): PAXLOVID What should I tell my care team before I take this medication? They need to know if you have any of these conditions: Any allergies Any serious illness Kidney disease Liver disease An unusual or allergic reaction to nirmatrelvir, ritonavir, other medications, foods, dyes, or preservatives Pregnant or trying to get pregnant Breast-feeding How should I use this medication?  This product contains 2 different medications that are packaged together. For the standard dose, take 2 pink tablets of nirmatrelvir with 1 white tablet of ritonavir (3 tablets total) by mouth with water twice daily. Talk to your care team if you have kidney disease. You may need a different dose. Swallow the tablets whole. You can take it with or without food. If it upsets your stomach, take it with food. Take all of this medication unless your care team tells you to stop it early. Keep taking it even if you think you are better. Talk to your care team about the use of this medication in children. While  it may be prescribed for children as young as 12 years for selected conditions, precautions do apply. Overdosage: If you think you have taken too much of this medicine contact a poison control center or emergency room at once. NOTE: This medicine is only for you. Do not share this medicine with others. What if I miss a dose? If you miss a dose, take it as soon as you can unless it is more than 8 hours late. If it is more than 8 hours late, skip the missed dose. Take the next dose at the normal time. Do not take extra or 2 doses at the same time to make up for the missed dose. What may interact with this medication? Do not take this medication with any of the following medications: Alfuzosin Certain medications for anxiety or sleep like midazolam, triazolam Certain medications for cancer like apalutamide, enzalutamide Certain medications for cholesterol like lovastatin, simvastatin Certain medications for irregular heart beat like amiodarone, dronedarone, flecainide, propafenone, quinidine Certain medications for pain like meperidine, piroxicam Certain medications for psychotic disorders like clozapine, lurasidone, pimozide Certain medications for seizures like carbamazepine, phenobarbital, phenytoin Colchicine Eletriptan Eplerenone Ergot alkaloids like dihydroergotamine, ergonovine, ergotamine, methylergonovine Finerenone Flibanserin Ivabradine Lomitapide Naloxegol Ranolazine Rifampin Sildenafil Silodosin St. John's Wort Tolvaptan Ubrogepant Voclosporin This medication may also interact with the following medications: Bedaquiline Birth control pills Bosentan Certain antibiotics like erythromycin or clarithromycin Certain medications for blood pressure like amlodipine, diltiazem, felodipine, nicardipine, nifedipine Certain medications for cancer like abemaciclib, ceritinib, dasatinib, encorafenib, ibrutinib, ivosidenib, neratinib, nilotinib, venetoclax, vinblastine,  vincristine Certain medications for cholesterol like atorvastatin, rosuvastatin Certain medications for depression like bupropion, trazodone Certain medications for fungal infections like isavuconazonium, itraconazole, ketoconazole, voriconazole Certain medications for hepatitis C like elbasvir; grazoprevir, dasabuvir; ombitasvir; paritaprevir; ritonavir, glecaprevir; pibrentasvir, sofosbuvir; velpatasvir; voxilaprevir Certain medications for HIV or AIDS Certain medications for irregular heartbeat like lidocaine Certain medications that treat or prevent blood clots like rivaroxaban, warfarin Digoxin Fentanyl Medications that lower your chance of fighting infection like cyclosporine, sirolimus, tacrolimus Methadone Quetiapine Rifabutin Salmeterol Steroid medications like betamethasone, budesonide, ciclesonide, dexamethasone, fluticasone, methylprednisone, mometasone, triamcinolone This list may not describe all possible interactions. Give your health care provider a list of all the medicines, herbs, non-prescription drugs, or dietary supplements you use. Also tell them if you smoke, drink alcohol, or use illegal drugs. Some items may interact with your medicine. What should I watch for while using this medication? Your condition will be monitored carefully while you are receiving this medication. Visit your care team for regular checkups. Tell your care team if your symptoms do not start to get better or if they get worse. If you have untreated HIV infection, this medication may lead to some HIV medications not working as well in the future. Birth control may not work properly while you are taking this medication. Talk to  your care team about using an extra method of birth control. What side effects may I notice from receiving this medication? Side effects that you should report to your care team as soon as possible: Allergic reactions--skin rash, itching, hives, swelling of the face, lips,  tongue, or throat Liver injury--right upper belly pain, loss of appetite, nausea, light-colored stool, dark yellow or brown urine, yellowing skin or eyes, unusual weakness or fatigue Redness, blistering, peeling, or loosening of the skin, including inside the mouth Side effects that usually do not require medical attention (report these to your care team if they continue or are bothersome): Change in taste Diarrhea General discomfort and fatigue Increase in blood pressure Muscle pain Nausea Stomach pain This list may not describe all possible side effects. Call your doctor for medical advice about side effects. You may report side effects to FDA at 1-800-FDA-1088. Where should I keep my medication? Keep out of the reach of children and pets. Store at room temperature between 20 and 25 degrees C (68 and 77 degrees F). Get rid of any unused medication after the expiration date. To get rid of medications that are no longer needed or have expired: Take the medication to a medication take-back program. Check with your pharmacy or law enforcement to find a location. If you cannot return the medication, check the label or package insert to see if the medication should be thrown out in the garbage or flushed down the toilet. If you are not sure, ask your care team. If it is safe to put it in the trash, take the medication out of the container. Mix the medication with cat litter, dirt, coffee grounds, or other unwanted substance. Seal the mixture in a bag or container. Put it in the trash. NOTE: This sheet is a summary. It may not cover all possible information. If you have questions about this medicine, talk to your doctor, pharmacist, or health care provider.  2022 Elsevier/Gold Standard (2021-09-14 00:00:00)   10 Things You Can Do to Manage Your COVID-19 Symptoms at Home If you have possible or confirmed COVID-19 Stay home except to get medical care. Monitor your symptoms carefully. If your  symptoms get worse, call your healthcare provider immediately. Get rest and stay hydrated. If you have a medical appointment, call the healthcare provider ahead of time and tell them that you have or may have COVID-19. For medical emergencies, call 911 and notify the dispatch personnel that you have or may have COVID-19. Cover your cough and sneezes with a tissue or use the inside of your elbow. Wash your hands often with soap and water for at least 20 seconds or clean your hands with an alcohol-based hand sanitizer that contains at least 60% alcohol. As much as possible, stay in a specific room and away from other people in your home. Also, you should use a separate bathroom, if available. If you need to be around other people in or outside of the home, wear a mask. Avoid sharing personal items with other people in your household, like dishes, towels, and bedding. Clean all surfaces that are touched often, like counters, tabletops, and doorknobs. Use household cleaning sprays or wipes according to the label instructions. [email or web address] 07/11/2020 This information is not intended to replace advice given to you by your health care provider. Make sure you discuss any questions you have with your health care provider. Document Revised: 09/04/2021 Document Reviewed: 09/04/2021 Elsevier Patient Education  2022 [employer].    If you have  been instructed to have an in-person evaluation today at a local Urgent Care facility, please use the link below. It will take you to a list of all of our available [facility], including address, phone number and hours of operation. Please do not delay care.  [facility]  If you or a family member do not have a primary care provider, use the link below to schedule a visit and establish care. When you choose a [facility] primary care physician or advanced practice provider, you gain a long-term partner in health. Find a Primary Care  Provider  Learn more about [facility] in-office and virtual care options: [facility] - Get Care Now

## 2021-12-09 ENCOUNTER — Telehealth: Payer: Self-pay | Admitting: *Deleted

## 2021-12-09 NOTE — Telephone Encounter (Signed)
Call to patient regarding new symptoms:Diarrhea Patient states she has had 2 episodes of diarrhea with some abdominal cramping today. Patient states she is hydrating and feels it is related to sinus drainage and multiple medications she has been taking. Patient advised:      Drink oral fluids and eat bland foods.     Avoid alcohol, spicy foods, caffeine or fatty foods that could make diarrhea worse.     Continue to monitor for signs of dehydration( increased thirst, decreased urine output, yellow urine, dry skin, headache, dizziness)     Try over the counter medication(Imodium, kaopectate, Pepto-Bismol) as manufacturer instructions advise     If diarrhea gets worse and becomes severe- notify provider. Copy of call sent to patient as reference

## 2022-02-02 ENCOUNTER — Telehealth (INDEPENDENT_AMBULATORY_CARE_PROVIDER_SITE_OTHER): Payer: 59 | Admitting: Medical-Surgical

## 2022-02-02 ENCOUNTER — Encounter: Payer: Self-pay | Admitting: Medical-Surgical

## 2022-02-02 DIAGNOSIS — N926 Irregular menstruation, unspecified: Secondary | ICD-10-CM

## 2022-02-02 DIAGNOSIS — Z789 Other specified health status: Secondary | ICD-10-CM | POA: Diagnosis not present

## 2022-02-02 DIAGNOSIS — Z01419 Encounter for gynecological examination (general) (routine) without abnormal findings: Secondary | ICD-10-CM

## 2022-02-02 DIAGNOSIS — L732 Hidradenitis suppurativa: Secondary | ICD-10-CM

## 2022-02-02 NOTE — Progress Notes (Signed)
Virtual Visit via Video Note  I connected with Melissa Sheppard on 02/02/22 at  9:50 AM EST by a video enabled telemedicine application and verified that I am speaking with the correct person using two identifiers.   I discussed the limitations of evaluation and management by telemedicine and the availability of in person appointments. The patient expressed understanding and agreed to proceed.  Patient location: home Provider locations: office  Subjective:    CC: Conception/pregnancy  HPI: Pleasant 31 year old female presenting today via MyChart video visit to discuss conception efforts and a missed period.  She had her Nexplanon taken out about 6 weeks ago and she and her partner are actively trying to conceive.  Noted that her menstrual cycle was supposed to start about 2 weeks ago and it did not.  She has taken pregnancy tests at home x2 with negative results.  Unfortunately, she reports that about 20 minutes before our appointment, she started to have vaginal bleeding and what she suspects to be her period.  She would like to have a blood test to determine if she is truly pregnant or not.  Past medical history, Surgical history, Family history not pertinant except as noted below, Social history, Allergies, and medications have been entered into the medical record, reviewed, and corrections made.   Review of Systems: See HPI for pertinent positives and negatives.   Objective:    General: Speaking clearly in complete sentences without any shortness of breath.  Alert and oriented x3.  Normal judgment. No apparent acute distress.  Impression and Recommendations:    1. Attempting to conceive 2. Missed period 3. Well woman exam Negative pregnancy test but some spotting today.  We will go ahead and check a beta-hCG.  Referring to OB/GYN per patient request.  Continue prenatal vitamin.  Reviewed medications to avoid while attempting to conceive.  Reviewed options to help track fertility  including basal body temperature tracking. - Ambulatory referral to Obstetrics / Gynecology - Beta HCG, Quant  4. Hidradenitis suppurativa Referring to dermatology per patient request. - Ambulatory referral to Dermatology  I discussed the assessment and treatment plan with the patient. The patient was provided an opportunity to ask questions and all were answered. The patient agreed with the plan and demonstrated an understanding of the instructions.   The patient was advised to call back or seek an in-person evaluation if the symptoms worsen or if the condition fails to improve as anticipated.  25 minutes of non-face-to-face time was provided during this encounter.  Return if symptoms worsen or fail to improve.  Thayer Ohm, DNP, APRN, FNP-BC [facility]

## 2022-02-02 NOTE — Progress Notes (Signed)
Negative pregnancy tests Trying to get pregnant Possibly started menstrual cycle today

## 2022-05-07 ENCOUNTER — Telehealth: Payer: 59 | Admitting: Medical-Surgical

## 2022-05-13 NOTE — Progress Notes (Signed)
ANNUAL EXAM Patient name: Melissa Sheppard MRN [id number]  Date of birth: 02-12-1991 Chief Complaint:   Annual Exam  History of Present Illness:   Melissa Sheppard is a 31 y.o. No obstetric history on file. female being seen today for a routine annual exam.   Current complaints: None. Notes TTC since January. Had Nexplanon out prior to that. Partner is Jonny Ruiz - been together for 14 years. Has + OPKs. Cycles regular.   Patient's last menstrual period was 05/01/2022.   Last pap 2017. Results were:  normal per pt . H/O abnormal pap: no      07/29/2021    3:51 PM 06/02/2021    4:21 PM  Depression screen PHQ 2/9  Decreased Interest  1  Down, Depressed, Hopeless  2  PHQ - 2 Score  3  Altered sleeping  2  Tired, decreased energy  2  Change in appetite  3  Feeling bad or failure about yourself   3  Trouble concentrating  2  Moving slowly or fidgety/restless  0  Suicidal thoughts  0  PHQ-9 Score  15  Difficult doing work/chores  Not difficult at all     Information is confidential and restricted. Go to Review Flowsheets to unlock data.        06/02/2021    4:21 PM  GAD 7 : Generalized Anxiety Score  Nervous, Anxious, on Edge 1  Control/stop worrying 2  Worry too much - different things 2  Trouble relaxing 1  Restless 0  Easily annoyed or irritable 1  Afraid - awful might happen 1  Total GAD 7 Score 8  Anxiety Difficulty Somewhat difficult     Review of Systems:   Pertinent items are noted in HPI Denies any headaches, blurred vision, fatigue, shortness of breath, chest pain, abdominal pain, abnormal vaginal discharge/itching/odor/irritation, problems with periods, bowel movements, urination, or intercourse unless otherwise stated above. Pertinent History Reviewed:  Reviewed past medical,surgical, social and family history.  Reviewed problem list, medications and allergies. Physical Assessment:   Vitals:   05/20/22 0954  BP: 115/73  Pulse: 80  Weight: 180 lb  (81.6 kg)  Height: 5\' 8"  (1.727 m)  Body mass index is 27.37 kg/m.   Physical Examination:  General appearance - well appearing, and in no distress Mental status - alert, oriented to person, place, and time Psych:  She has a normal mood and affect Skin - warm and dry, normal color, no suspicious lesions noted Chest - effort normal, all lung fields clear to auscultation bilaterally Heart - normal rate and regular rhythm Neck:  midline trachea, no thyromegaly or nodules Breasts - breasts appear normal, no suspicious masses, no skin or nipple changes or axillary nodes Abdomen - soft, nontender, nondistended, no masses or organomegaly Pelvic -  VULVA: normal appearing vulva with no masses, tenderness or lesions  VAGINA: normal appearing vagina with normal color and discharge, no lesions   CERVIX: normal appearing cervix without discharge or lesions, no CMT UTERUS: uterus is felt to be normal size, shape, consistency and nontender  ADNEXA: No adnexal masses or tenderness noted. Extremities:  No swelling or varicosities noted  Chaperone present for exam  No results found for this or any previous visit (from the past 24 hour(s)).  Assessment & Plan:  [name] was seen today for annual exam.  Diagnoses and all orders for this visit:  Encounter for annual routine gynecological examination  - Cervical cancer screening: Discussed guidelines. Pap with HPV done - Gardasil:  completed - GC/CT: accepts - Birth Control: TTC - she is on MVI. Reviewed TTC for one year and if not pregnant, to call for evaluation.  - Breast Health: Encouraged self breast awareness/SBE. Teaching provided.  - F/U 12 months and prn   No orders of the defined types were placed in this encounter.   Meds: No orders of the defined types were placed in this encounter.   Follow-up: Return in about 1 year (around 05/21/2023) for annual.  [name], MD 05/20/2022 10:32 AM

## 2022-05-20 ENCOUNTER — Ambulatory Visit (INDEPENDENT_AMBULATORY_CARE_PROVIDER_SITE_OTHER): Payer: 59 | Admitting: Obstetrics and Gynecology

## 2022-05-20 ENCOUNTER — Encounter: Payer: Self-pay | Admitting: Obstetrics and Gynecology

## 2022-05-20 ENCOUNTER — Other Ambulatory Visit (HOSPITAL_COMMUNITY)
Admission: RE | Admit: 2022-05-20 | Discharge: 2022-05-20 | Disposition: A | Discharge status: Home / Self Care | Payer: 59 | Source: Ambulatory Visit | Attending: Obstetrics and Gynecology | Admitting: Obstetrics and Gynecology

## 2022-05-20 VITALS — BP 115/73 | HR 80 | Ht 68.0 in | Wt 180.0 lb

## 2022-05-20 DIAGNOSIS — Z01419 Encounter for gynecological examination (general) (routine) without abnormal findings: Secondary | ICD-10-CM | POA: Insufficient documentation

## 2022-05-27 LAB — CYTOLOGY - PAP
Chlamydia: NEGATIVE
Comment: NEGATIVE
Comment: NEGATIVE
Comment: NORMAL
Diagnosis: NEGATIVE
High risk HPV: NEGATIVE
Neisseria Gonorrhea: NEGATIVE

## 2022-08-17 ENCOUNTER — Other Ambulatory Visit (INDEPENDENT_AMBULATORY_CARE_PROVIDER_SITE_OTHER): Payer: 59

## 2022-08-17 DIAGNOSIS — Z3200 Encounter for pregnancy test, result unknown: Secondary | ICD-10-CM

## 2022-08-17 DIAGNOSIS — O3680X1 Pregnancy with inconclusive fetal viability, fetus 1: Secondary | ICD-10-CM

## 2022-08-17 NOTE — Progress Notes (Signed)
Pt here for BHCG. Pt had positive UPT at home and does not know LMP. Pt denies bleeding. Pt states she has very mild cramping. Pt was told to call office if cramping worsens and/or she has any bleeding. Pt expressed understanding.Pt sent to lab.

## 2022-08-18 ENCOUNTER — Telehealth: Payer: Self-pay | Admitting: *Deleted

## 2022-08-18 LAB — HCG, QUANTITATIVE, PREGNANCY: HCG, Total, QN: 215 m[IU]/mL

## 2022-08-18 NOTE — Telephone Encounter (Cosign Needed)
Pt notified of BHCG results of 215.  Pt is scheduled for NOB 09/14/22 with D Simpson,CNM.  Pt denies any bleeding or pain at this time.  She is taking a PNV.  Encouraged pt to call if any issues prior to her appt.  Pt aware that 1 support person can accompany her at the appt.

## 2022-08-24 ENCOUNTER — Ambulatory Visit: Payer: 59 | Admitting: Medical-Surgical

## 2022-09-14 ENCOUNTER — Ambulatory Visit (INDEPENDENT_AMBULATORY_CARE_PROVIDER_SITE_OTHER): Payer: 59

## 2022-09-14 ENCOUNTER — Other Ambulatory Visit (HOSPITAL_COMMUNITY)
Admission: RE | Admit: 2022-09-14 | Discharge: 2022-09-14 | Disposition: A | Discharge status: Home / Self Care | Payer: 59 | Source: Ambulatory Visit

## 2022-09-14 VITALS — BP 129/87 | HR 68 | Wt 179.0 lb

## 2022-09-14 DIAGNOSIS — O219 Vomiting of pregnancy, unspecified: Secondary | ICD-10-CM | POA: Diagnosis not present

## 2022-09-14 DIAGNOSIS — Z3490 Encounter for supervision of normal pregnancy, unspecified, unspecified trimester: Secondary | ICD-10-CM | POA: Diagnosis present

## 2022-09-14 DIAGNOSIS — Z3A01 Less than 8 weeks gestation of pregnancy: Secondary | ICD-10-CM

## 2022-09-14 LAB — HEPATITIS C ANTIBODY: HCV Ab: NEGATIVE

## 2022-09-14 MED ORDER — DOXYLAMINE-PYRIDOXINE 10-10 MG PO TBEC: DELAYED_RELEASE_TABLET | ORAL | 0 refills | Status: DC

## 2022-09-14 NOTE — Patient Instructions (Addendum)

## 2022-09-14 NOTE — Progress Notes (Signed)
Subjective:   Melissa Sheppard is a 31 y.o. G2P0010 at [redacted]w[redacted]d by LMP being seen today for her first obstetrical visit.  Her obstetrical history is significant for  none  and has Right shoulder pain; Hidradenitis suppurativa; and Supervision of normal pregnancy on their problem list.. Patient does intend to breast feed. Pregnancy history fully reviewed.  Patient reports nausea.  HISTORY: OB History  Gravida Para Term Preterm AB Living  2 0 0 0 1 0  SAB IAB Ectopic Multiple Live Births  1 0 0 0 0    # Outcome Date GA Lbr Len/2nd Weight Sex Delivery Anes PTL Lv  2 Current           1 SAB            Past Medical History:  Diagnosis Date   No pertinent past medical history    Past Surgical History:  Procedure Laterality Date   NO PAST SURGERIES     Family History  Problem Relation Age of Onset   Stroke Mother    Diabetes Father    Hypertension Other    Breast cancer Cousin    Social History   Tobacco Use   Smoking status: Never   Smokeless tobacco: Never  Vaping Use   Vaping Use: Never used  Substance Use Topics   Alcohol use: Yes    Comment: occasionally   Drug use: Never   Allergies  Allergen Reactions   Amoxicillin Rash   Current Outpatient Medications on File Prior to Visit  Medication Sig Dispense Refill   clindamycin (CLEOCIN T) 1 % SWAB Apply 1 application topically 2 (two) times daily. 60 each 1   Prenatal Vit-Fe Fumarate-FA (PRENATAL VITAMIN PO) Take by mouth.     ondansetron (ZOFRAN) 4 MG tablet Take 1 tablet (4 mg total) by mouth every 8 (eight) hours as needed for nausea or vomiting. (Patient not taking: Reported on 09/14/2022) 20 tablet 0   No current facility-administered medications on file prior to visit.   Indications for ASA therapy (per uptodate) One of the following: Previous pregnancy with preeclampsia, especially early onset and with an adverse outcome No Multifetal gestation No Chronic hypertension No Type 1 or 2 diabetes mellitus  No Chronic kidney disease No Autoimmune disease (antiphospholipid syndrome, systemic lupus erythematosus) No  Two or more of the following: Nulliparity Yes Obesity (body mass index >30 kg/m2) No Family history of preeclampsia in mother or sister No Age ?35 years No Sociodemographic characteristics (African American race, low socioeconomic level) Yes Personal risk factors (eg, previous pregnancy with low birth weight or small for gestational age infant, previous adverse pregnancy outcome [eg, stillbirth], interval >10 years between pregnancies) No  Exam   Vitals:   09/14/22 1014  BP: 129/87  Pulse: 68  Weight: 179 lb (81.2 kg)      Uterus:     Pelvic Exam: Perineum: no hemorrhoids, normal perineum   Vulva: normal external genitalia, no lesions   Vagina:  normal mucosa, normal discharge   Cervix: no lesions and normal, pap smear done.    Adnexa: normal adnexa and no mass, fullness, tenderness   Bony Pelvis: average  System: General: well-developed, well-nourished female in no acute distress   Breast:  normal appearance, no masses or tenderness   Skin: normal coloration and turgor, no rashes   Neurologic: oriented, normal, negative, normal mood   Extremities: normal strength, tone, and muscle mass, ROM of all joints is normal   HEENT PERRLA,  extraocular movement intact and sclera clear, anicteric   Mouth/Teeth mucous membranes moist, pharynx normal without lesions and dental hygiene good   Neck supple and no masses   Cardiovascular: regular rate and rhythm   Respiratory:  no respiratory distress, normal breath sounds   Abdomen: soft, non-tender; bowel sounds normal; no masses,  no organomegaly     Assessment:   Pregnancy: G2P0010 Patient Active Problem List   Diagnosis Date Noted   Supervision of normal pregnancy 09/14/2022   Right shoulder pain 06/09/2021   Hidradenitis suppurativa 05/11/2013     Plan:  1. Encounter for supervision of normal pregnancy, antepartum,  unspecified gravidity - NOB. Welcomed patient to practice. - Planned pregnancy. Congratulations given - Reviewed practice model - [facility] and [facility] locations discussed - Plan for bASA at 12 weeks- AA/nullip  - US OB Limited; Future - Culture, OB Urine - Cervicovaginal ancillary only( [place]) - Obstetric panel - HIV antibody (with reflex) - Hepatitis C Antibody - Hemoglobinopathy Evaluation - Babyscripts Schedule Optimization  2. Nausea and vomiting during pregnancy - Nausea daily. Vomiting occasionally. Using ginger chews which helps for the most part, but requesting rx for something else. Will send rx for diclegis.  - Doxylamine-Pyridoxine 10-10 MG TBEC; Take 2 tabs at bedtime. If needed, add another tab in the morning. If needed, add another tab in the afternoon, up to 4 tabs/day.  Dispense: 120 tablet; Refill: 0  3. [redacted] weeks gestation of pregnancy    Initial labs drawn. Continue prenatal vitamins. Discussed and offered genetic screening options, including Quad screen/AFP, NIPS testing, and option to decline testing. Benefits/risks/alternatives reviewed. Pt aware that anatomy US is form of genetic screening with lower accuracy in detecting trisomies than blood work.  Pt chooses/declines genetic screening today. NIPS: requested. Ultrasound discussed; fetal anatomic survey: requested. Problem list reviewed and updated. The nature of [facility] with multiple MDs and other Advanced Practice Providers was explained to patient; also emphasized that residents, students are part of our team. Routine obstetric precautions reviewed. Return in about 5 weeks (around 10/19/2022) for LOB.   Brand Males, CNM 09/14/22 1:11 PM

## 2022-09-15 LAB — CERVICOVAGINAL ANCILLARY ONLY
Chlamydia: NEGATIVE
Comment: NEGATIVE
Comment: NORMAL
Neisseria Gonorrhea: NEGATIVE

## 2022-09-17 LAB — CULTURE, OB URINE

## 2022-09-17 LAB — URINE CULTURE, OB REFLEX

## 2022-09-20 LAB — OBSTETRIC PANEL
Absolute Monocytes: 588 cells/uL (ref 200–950)
Antibody Screen: NOT DETECTED
Basophils Absolute: 34 cells/uL (ref 0–200)
Basophils Relative: 0.4 %
Eosinophils Absolute: 50 cells/uL (ref 15–500)
Eosinophils Relative: 0.6 %
HCT: 39.1 % (ref 35.0–45.0)
Hemoglobin: 12.6 g/dL (ref 11.7–15.5)
Hepatitis B Surface Ag: NONREACTIVE
Lymphs Abs: 1663 cells/uL (ref 850–3900)
MCH: 27.8 pg (ref 27.0–33.0)
MCHC: 32.2 g/dL (ref 32.0–36.0)
MCV: 86.3 fL (ref 80.0–100.0)
MPV: 10.5 fL (ref 7.5–12.5)
Monocytes Relative: 7 %
Neutro Abs: 6065 cells/uL (ref 1500–7800)
Neutrophils Relative %: 72.2 %
Platelets: 252 10*3/uL (ref 140–400)
RBC: 4.53 10*6/uL (ref 3.80–5.10)
RDW: 11.8 % (ref 11.0–15.0)
RPR Ser Ql: NONREACTIVE
Rubella: 14.9 Index
Total Lymphocyte: 19.8 %
WBC: 8.4 10*3/uL (ref 3.8–10.8)

## 2022-09-20 LAB — HEMOGLOBINOPATHY EVALUATION
Fetal Hemoglobin Testing: <1 % (ref 0.0–1.9)
HCT: 39.1 % (ref 35.0–45.0)
Hemoglobin A2 - HGBRFX: 2.5 % (ref 2.2–3.2)
Hemoglobin: 12.6 g/dL (ref 11.7–15.5)
Hgb A: 97.5 % (ref >96.0)
MCH: 27.8 pg (ref 27.0–33.0)
MCV: 86.3 fL (ref 80.0–100.0)
RBC: 4.53 10*6/uL (ref 3.80–5.10)
RDW: 11.8 % (ref 11.0–15.0)

## 2022-09-20 LAB — HIV ANTIBODY (ROUTINE TESTING W REFLEX): HIV 1&2 Ab, 4th Generation: NONREACTIVE

## 2022-09-20 LAB — HEPATITIS C ANTIBODY: Hepatitis C Ab: NONREACTIVE

## 2022-09-23 ENCOUNTER — Ambulatory Visit (INDEPENDENT_AMBULATORY_CARE_PROVIDER_SITE_OTHER): Payer: 59

## 2022-09-23 DIAGNOSIS — Z0130 Encounter for examination of blood pressure without abnormal findings: Secondary | ICD-10-CM

## 2022-09-23 NOTE — Progress Notes (Signed)
Pt here for a BP check per Dr.Duncan. Pt has had elevated BP through Babyscripts and states she has been under stress recently. Pt has no complaints. BP in office is 127/71. Pt will return on 10/26 for ROB appt.

## 2022-09-27 ENCOUNTER — Encounter: Payer: Self-pay | Admitting: Obstetrics and Gynecology

## 2022-10-06 LAB — PANORAMA PRENATAL TEST FULL PANEL:PANORAMA TEST PLUS 5 ADDITIONAL MICRODELETIONS: FETAL FRACTION: 10.5

## 2022-10-13 LAB — HORIZON CUSTOM: REPORT SUMMARY: POSITIVE — AB

## 2022-10-20 IMAGING — DX DG LUMBAR SPINE COMPLETE 4+V
5 series · 5 of 5 positions shown · non-contrast
Comparison: None.

CLINICAL DATA: Tingling right lower leg.

EXAM:
LUMBAR SPINE - COMPLETE 4+ VIEW

[l-spine ap]
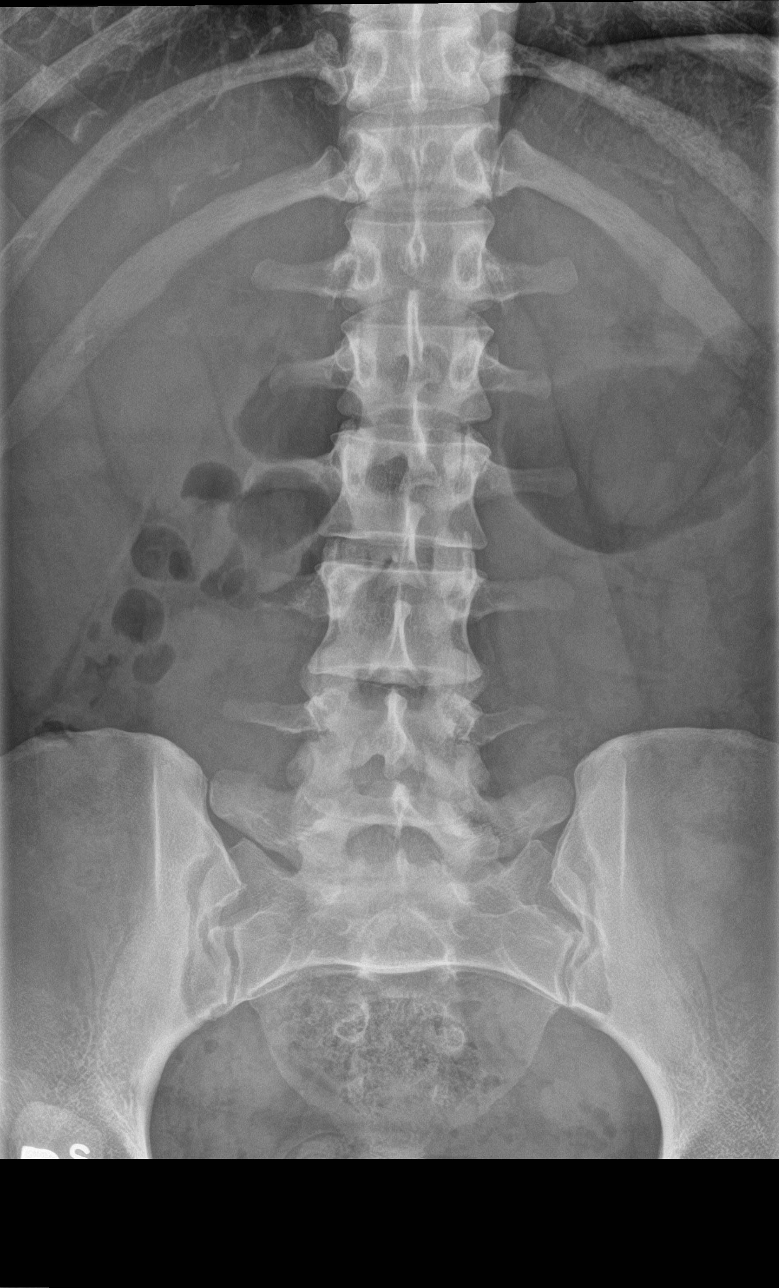

[l-spine obl (1 of 2)]
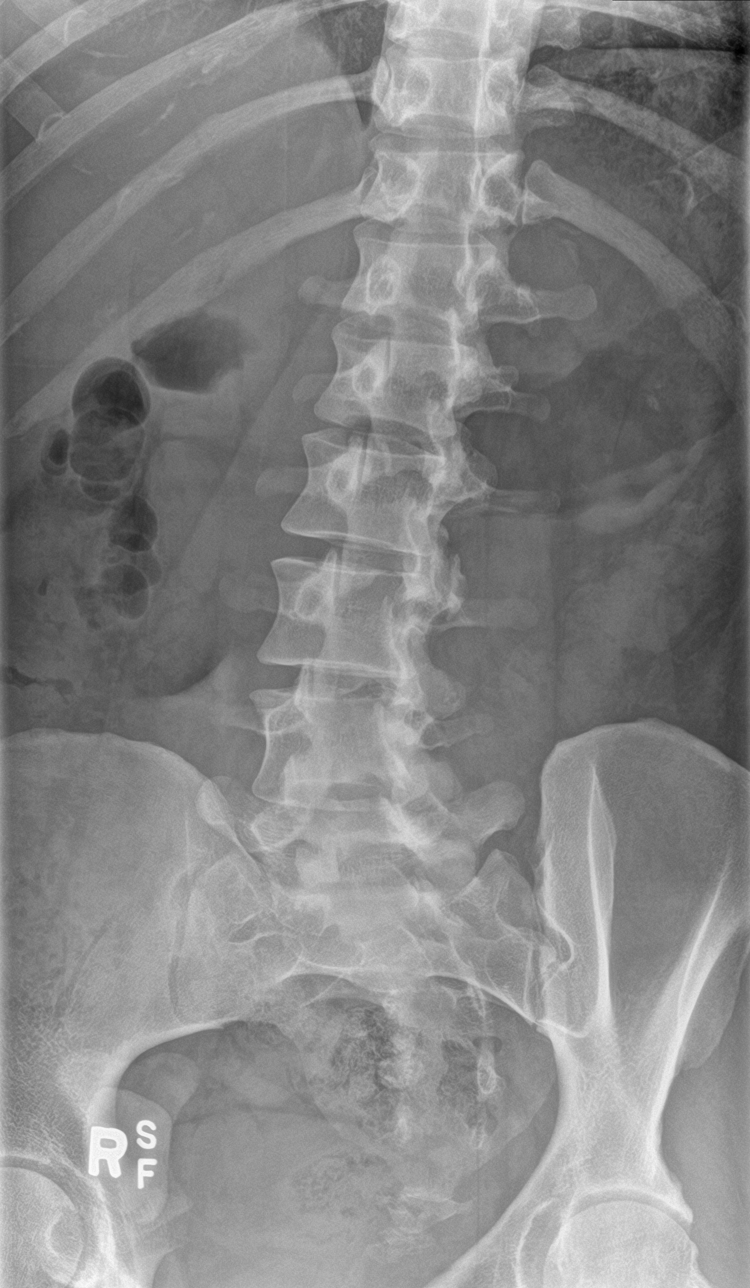

[l-spine obl (2 of 2)]
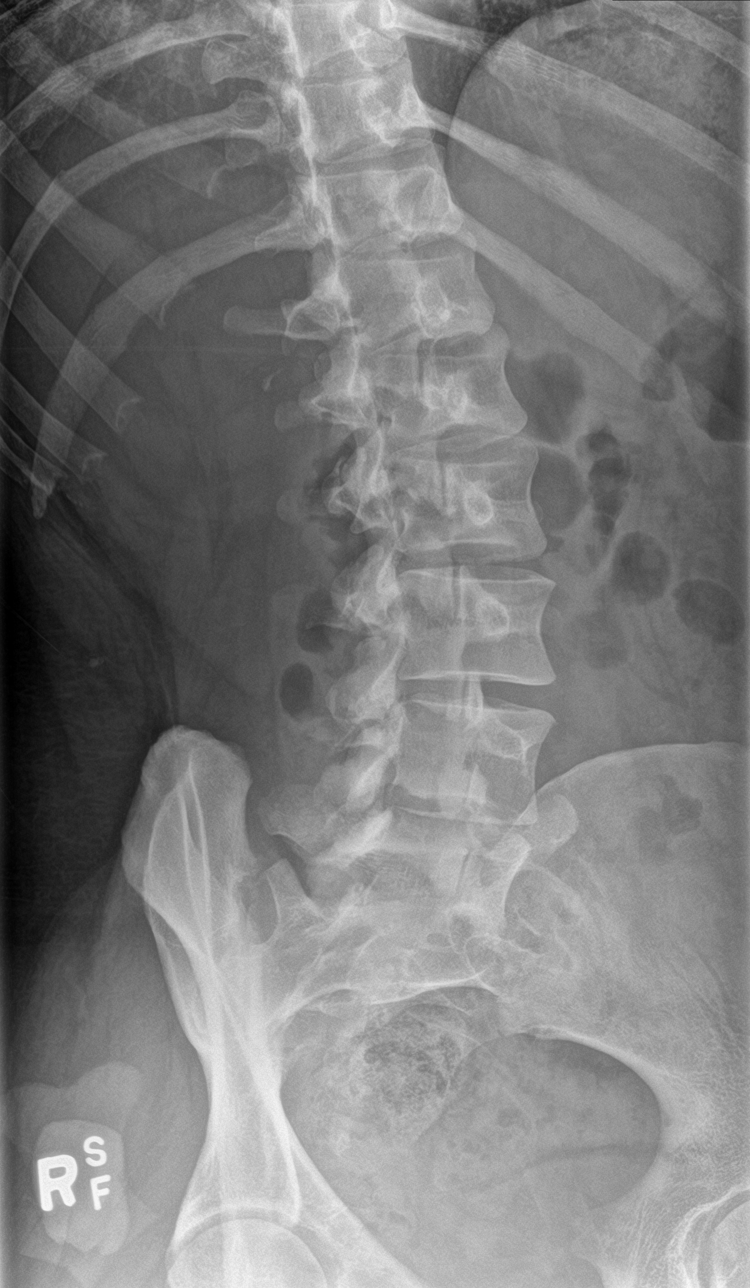

[l-spine lat]
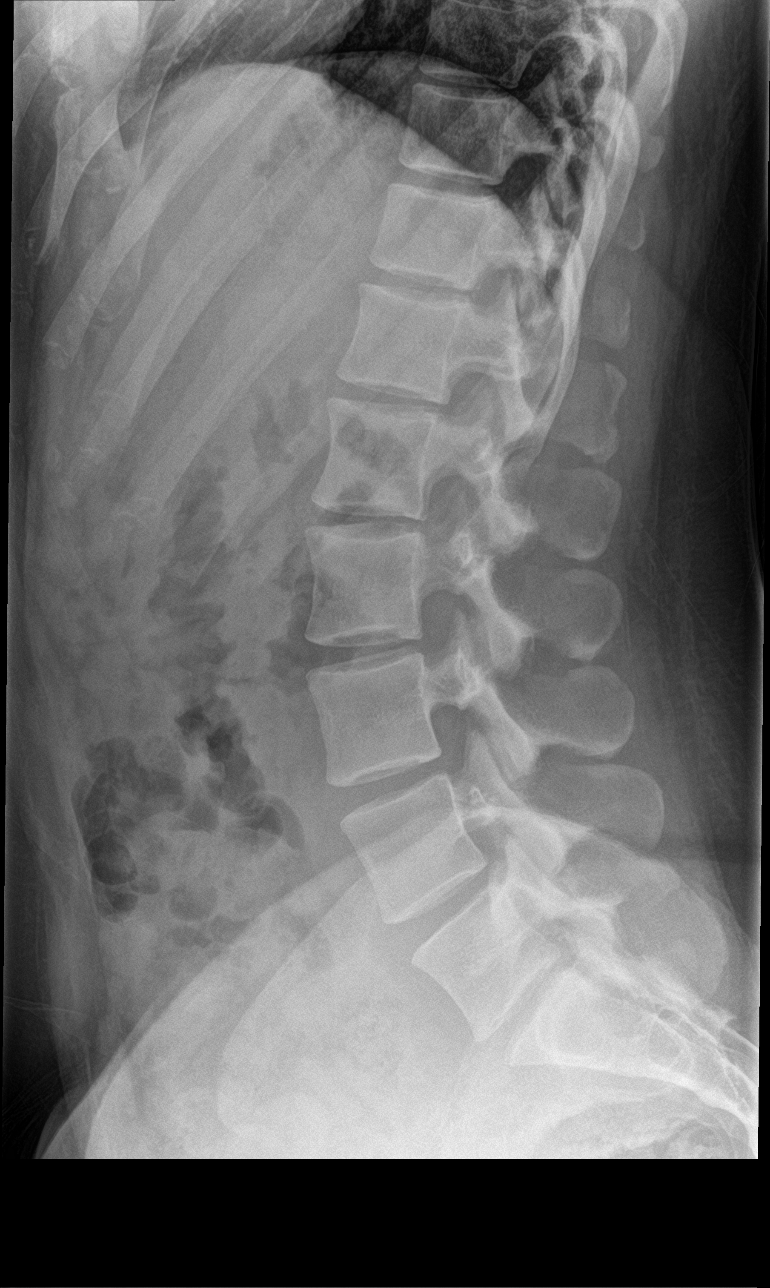

[l-spine spot]
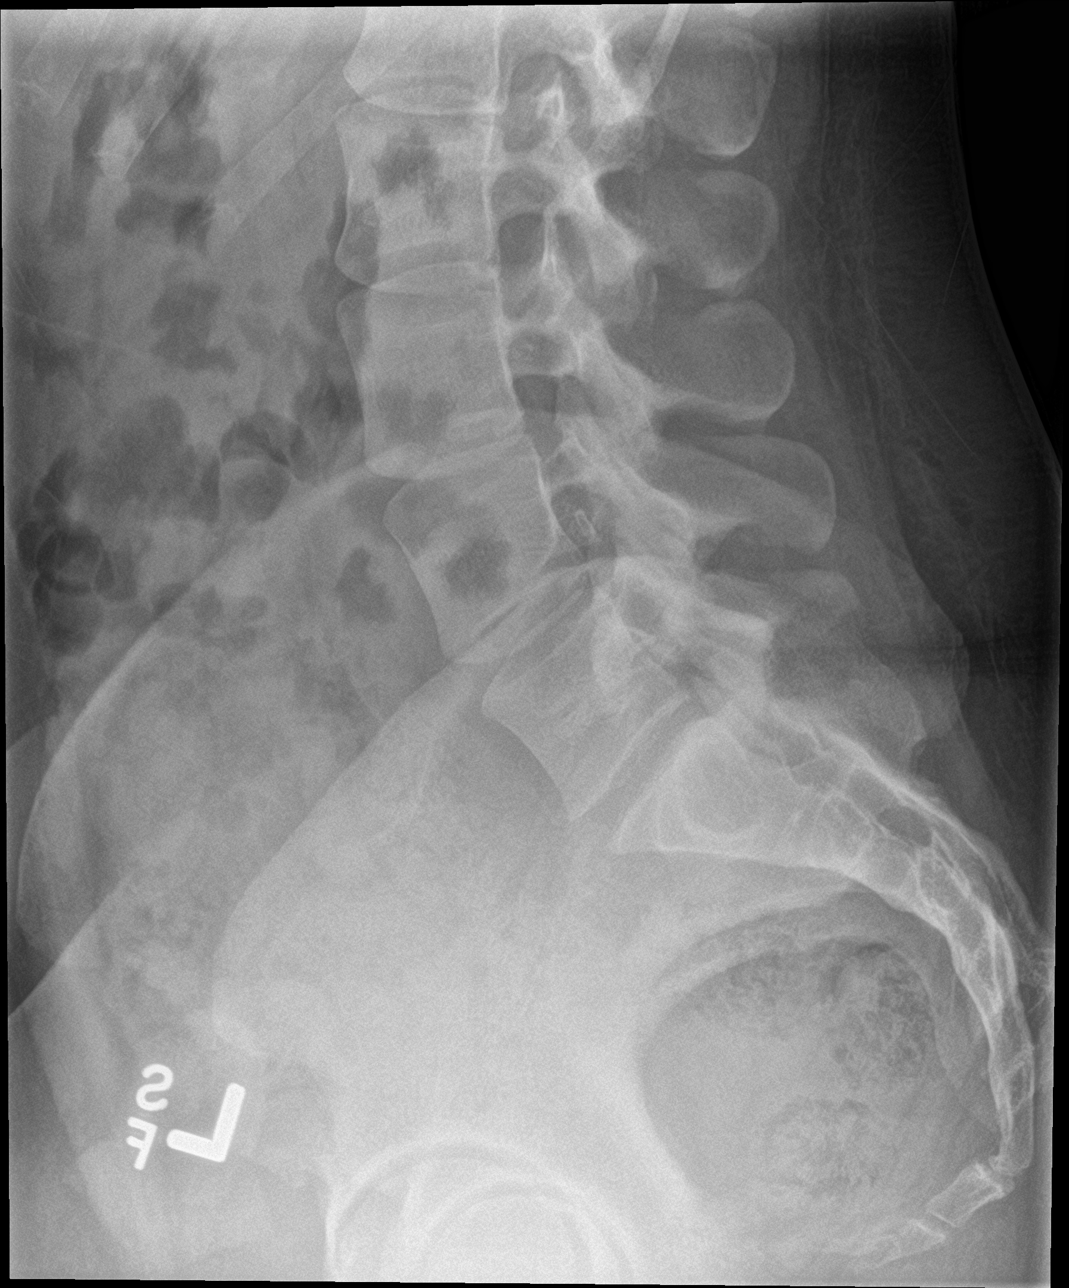

[5 of 5 positions shown; findings below may reference images not displayed]

FINDINGS: There is no evidence of lumbar spine fracture. Alignment is normal.
Intervertebral disc spaces are maintained.
IMPRESSION: Negative.

## 2022-10-21 ENCOUNTER — Encounter: Payer: Self-pay | Admitting: Obstetrics and Gynecology

## 2022-10-21 ENCOUNTER — Ambulatory Visit (INDEPENDENT_AMBULATORY_CARE_PROVIDER_SITE_OTHER): Payer: 59 | Admitting: Obstetrics and Gynecology

## 2022-10-21 VITALS — BP 126/75 | HR 82 | Wt 179.0 lb

## 2022-10-21 DIAGNOSIS — Z3A13 13 weeks gestation of pregnancy: Secondary | ICD-10-CM

## 2022-10-21 DIAGNOSIS — Z3400 Encounter for supervision of normal first pregnancy, unspecified trimester: Secondary | ICD-10-CM

## 2022-10-21 DIAGNOSIS — Z3401 Encounter for supervision of normal first pregnancy, first trimester: Secondary | ICD-10-CM

## 2022-10-21 NOTE — Progress Notes (Signed)
   PRENATAL VISIT NOTE  Subjective:  Melissa Sheppard is a 31 y.o. G2P0010 at [redacted]w[redacted]d being seen today for ongoing prenatal care.  She is currently monitored for the following issues for this low-risk pregnancy and has Right shoulder pain; Hidradenitis suppurativa; and Supervision of normal pregnancy on their problem list.  Patient reports no complaints.  Contractions: Not present. Vag. Bleeding: None.  Movement: Absent. Denies leaking of fluid.   The following portions of the patient's history were reviewed and updated as appropriate: allergies, current medications, past family history, past medical history, past social history, past surgical history and problem list.   Objective:   Vitals:   10/21/22 0813  BP: 126/75  Pulse: 82  Weight: 179 lb (81.2 kg)    Fetal Status: Fetal Heart Rate (bpm): 156   Movement: Absent     General:  Alert, oriented and cooperative. Patient is in no acute distress.  Skin: Skin is warm and dry. No rash noted.   Cardiovascular: Normal heart rate noted  Respiratory: Normal respiratory effort, no problems with respiration noted  Abdomen: Soft, gravid, appropriate for gestational age.  Pain/Pressure: Absent     Pelvic: Cervical exam deferred        Extremities: Normal range of motion.  Edema: None  Mental Status: Normal mood and affect. Normal behavior. Normal judgment and thought content.   Assessment and Plan:  Pregnancy: G2P0010 at [redacted]w[redacted]d 1. 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 Please refer to After Visit Summary for other counseling recommendations.   Return in about 4 weeks (around 11/18/2022) for in person, ROB, Low risk.  Future Appointments  Date Time Provider [place]  11/10/2022  9:30 AM Radene Gunning, MD [facility]  12/03/2022 12:45 PM [facility] NURSE [facility]  12/03/2022  1:00 PM [facility] US1 [facility]  12/03/2022  1:30 PM [facility] GENETIC COUNSELING RM [facility]    Mora Bellman, MD

## 2022-11-09 NOTE — Progress Notes (Unsigned)
   PRENATAL VISIT NOTE  Subjective:  Melissa Sheppard is a 31 y.o. G2P0010 at [redacted]w[redacted]d being seen today for ongoing prenatal care.  She is currently monitored for the following issues for this low-risk pregnancy and has Right shoulder pain; Hidradenitis suppurativa; and Supervision of normal pregnancy on their problem list.  Patient reports {[id number]}.   .  .   . Denies leaking of fluid.   The following portions of the patient's history were reviewed and updated as appropriate: allergies, current medications, past family history, past medical history, past social history, past surgical history and problem list.   Objective:  There were no vitals filed for this visit.  Fetal Status:           General:  Alert, oriented and cooperative. Patient is in no acute distress.  Skin: Skin is warm and dry. No rash noted.   Cardiovascular: Normal heart rate noted  Respiratory: Normal respiratory effort, no problems with respiration noted  Abdomen: Soft, gravid, appropriate for gestational age.        Pelvic: Cervical exam deferred        Extremities: Normal range of motion.     Mental Status: Normal mood and affect. Normal behavior. Normal judgment and thought content.   Assessment and Plan:  Pregnancy: G2P0010 at [redacted]w[redacted]d 1. Supervision of normal first pregnancy, antepartum Offered and recommended flu shot - pt *** MSAFP discussed and rx given.  Anatomy US scheduled for 12/8 Alpha thal carrier - has [facility] consult scheduled as well.  Confirmed ldASA use for PreE risk reduction - ***  Preterm labor symptoms and general obstetric precautions including but not limited to vaginal bleeding, contractions, leaking of fluid and fetal movement were reviewed in detail with the patient. Please refer to After Visit Summary for other counseling recommendations.   No follow-ups on file.  Future Appointments  Date Time Provider Department Center  11/10/2022  9:30 AM [name], MD [facility]   12/03/2022 12:45 PM [facility] NURSE [facility]  12/03/2022  1:00 PM [facility] US1 [facility]  12/03/2022  1:30 PM [facility] GENETIC COUNSELING RM [facility]    [name], MD

## 2022-11-10 ENCOUNTER — Ambulatory Visit (INDEPENDENT_AMBULATORY_CARE_PROVIDER_SITE_OTHER): Payer: 59 | Admitting: Obstetrics and Gynecology

## 2022-11-10 VITALS — BP 123/77 | HR 70 | Wt 184.0 lb

## 2022-11-10 DIAGNOSIS — Z3400 Encounter for supervision of normal first pregnancy, unspecified trimester: Secondary | ICD-10-CM

## 2022-11-10 DIAGNOSIS — Z3482 Encounter for supervision of other normal pregnancy, second trimester: Secondary | ICD-10-CM

## 2022-11-10 DIAGNOSIS — Z3A16 16 weeks gestation of pregnancy: Secondary | ICD-10-CM

## 2022-11-12 ENCOUNTER — Ambulatory Visit (INDEPENDENT_AMBULATORY_CARE_PROVIDER_SITE_OTHER): Payer: 59

## 2022-11-12 DIAGNOSIS — Z3400 Encounter for supervision of normal first pregnancy, unspecified trimester: Secondary | ICD-10-CM

## 2022-11-12 DIAGNOSIS — Z23 Encounter for immunization: Secondary | ICD-10-CM

## 2022-11-12 LAB — ALPHA FETOPROTEIN, MATERNAL
AFP MoM: 1.09
AFP, Serum: 36.4 ng/mL
Calc'd Gestational Age: 16 weeks
Maternal Wt: 185 [lb_av]
Risk for ONTD: <1
Twins-AFP: 1

## 2022-11-12 NOTE — Progress Notes (Signed)
Pt here for flu shot. Injection given and tolerated well. Pt will return on 12/14 for ROB.

## 2022-12-03 ENCOUNTER — Other Ambulatory Visit: Payer: Self-pay | Admitting: Obstetrics and Gynecology

## 2022-12-03 ENCOUNTER — Other Ambulatory Visit: Payer: Self-pay | Admitting: *Deleted

## 2022-12-03 ENCOUNTER — Ambulatory Visit: Payer: 59 | Admitting: *Deleted

## 2022-12-03 ENCOUNTER — Ambulatory Visit (HOSPITAL_BASED_OUTPATIENT_CLINIC_OR_DEPARTMENT_OTHER): Payer: 59

## 2022-12-03 ENCOUNTER — Ambulatory Visit: Payer: 59 | Attending: Obstetrics and Gynecology

## 2022-12-03 VITALS — BP 127/84 | HR 73

## 2022-12-03 DIAGNOSIS — Z363 Encounter for antenatal screening for malformations: Secondary | ICD-10-CM | POA: Insufficient documentation

## 2022-12-03 DIAGNOSIS — D563 Thalassemia minor: Secondary | ICD-10-CM

## 2022-12-03 DIAGNOSIS — Z3400 Encounter for supervision of normal first pregnancy, unspecified trimester: Secondary | ICD-10-CM

## 2022-12-03 DIAGNOSIS — Z3A19 19 weeks gestation of pregnancy: Secondary | ICD-10-CM | POA: Diagnosis not present

## 2022-12-03 DIAGNOSIS — Z148 Genetic carrier of other disease: Secondary | ICD-10-CM | POA: Insufficient documentation

## 2022-12-03 DIAGNOSIS — Z362 Encounter for other antenatal screening follow-up: Secondary | ICD-10-CM

## 2022-12-03 DIAGNOSIS — O358XX0 Maternal care for other (suspected) fetal abnormality and damage, not applicable or unspecified: Secondary | ICD-10-CM | POA: Diagnosis not present

## 2022-12-03 NOTE — Progress Notes (Signed)
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   EDC: 04/27/2023 LMP: 07/21/2022 Referring Provider:  Catalina Antigua, MD  EGA: [redacted]w[redacted]d Genetic Counselor: Teena Dunk, MS, CGC  OB Hx: [id number] Date of Appointment: 12/03/2022  Accompanied by: Father of the current pregnancy, Maeleigh Buschman Face to Face Time: 30 Minutes   Previous Testing Completed: Kierria previously completed cell-free DNA screening (cfDNA) in this pregnancy. The result is low risk, consistent with a female fetus. This screening significantly reduces the risk that the current pregnancy has Down syndrome, Trisomy 69, Trisomy 35, and common sex chromosome conditions, however, the risk is not zero given the limitations of cfDNA. Additionally, there are many genetic conditions that cannot be detected by cfDNA.  Joscelyne previously completed carrier screening. She screened to be a silent carrier for alpha thalassemia. She screened to not be a carrier for Cystic Fibrosis (CF), Spinal Muscular Atrophy (SMA), and Beta Hemoglobinopathies. A negative result on carrier screening reduces the likelihood of being a carrier, however, does not entirely rule out the possibility. Nastashia previously completed a maternal serum AFP screen in this pregnancy. The result is screen negative. A negative result reduces the risk that the current pregnancy has an open neural tube defect. Closed neural tube defects and some open defects may not be detected by this screen.    Medical & Family History:  This is Renalda's 2nd pregnancy. She has had one elective loss.  Reports she takes prenatal vitamins. Denies personal history of diabetes, high blood pressure, thyroid conditions, and seizures. Denies bleeding, infections, and fevers in this pregnancy. Denies using  tobacco, alcohol, or street drugs in this pregnancy.  Maternal ethnicity reported as African American/Black and paternal ethnicity reported as Caucasian/White. Denies consanguinity. Denies Jewish ancestry.  Marilene denied a family history of chromosome conditions, intellectual disability, autism, birth defects, bone/skeletal disorders, blindness, deafness, blood disorders, neuromuscular disorders, still births, early infant deaths, and 3 or more pregnancy losses for one person on her prenatal intake questionnaire.      Genetic Counseling:   Maternal Silent Carrier for Alpha Thalassemia. Alpha Thalassemia refers to a group of autosomal recessive blood disorders that reduce the amount of hemoglobin, the protein in red blood cells that carries oxygen to tissues throughout the body. Hemoglobin is made up of both alpha globin and beta globin proteins. Alpha Thalassemia is different in its inheritance compared to other hemoglobinopathies as there are two alpha-globin genes on each chromosome 16 (??/??). Alpha Thalassemia occurs when three or more of the four alpha-globin genes are deleted or changed. Seidy is a silent carrier for alpha thalassemia (??/-?) caused by the pathogenic alpha 3.7 deletion of the HBA2 gene. With this result, we know that Zeenat has three functional copies of the alpha-globin genes while her 4th alpha-globin gene is deleted. Each of Saxon's children will either inherit two functional genes (??) or one functional gene and one deletion (-?) from her.  Meleane is not at increased risk to have a baby with fetal hydrops due to Hemoglobin Barts disease (four deleted or changed alpha-globin genes: --/--) regardless of her reproductive partner's carrier status.  If Albertia's reproductive partner is found to be an Alpha Thalassemia carrier in the cis configuration (two deleted or changed alpha-globin genes on the same chromosome: ??/--) there would be a 25%  risk for the current pregnancy to be  affected with Hemoglobin H Disease (three deleted or changed alpha-globin genes: --/-?). Clinical features of this condition are highly variable and generally develop in the first years of life. People with severe symptoms may have chronic anemia, liver disease, and bone changes. Some affected individuals do not require blood transfusions while others may require occasional blood transfusions throughout their lifetime.  Given Tifani's carrier screening result, carrier screening for her reproductive partner was offered to determine risk for the current pregnancy. If both members of a couple are known to be carriers, diagnostic testing is available to determine if the pregnancy is affected. This can be performed via amniocentesis after [redacted] weeks gestation. If diagnostic testing via amniocentesis is not desired, Alpha Thalassemia testing can be completed after birth.    Testing/Screening Options:   Amniocentesis for Prenatal Diagnosis. This procedure involves the removal of a small amount of amniotic fluid from the sac surrounding the pregnancy with the use of a thin needle inserted through the maternal abdomen and uterus. Ultrasound guidance is used throughout the procedure. Possible procedural difficulties and complications that can arise include maternal infection, cramping, bleeding, fluid leakage, and/or pregnancy loss. The risk for pregnancy loss with an amniocentesis is 1/500. Per the [employer] of [employer]stetricians and Gynecologists (ACOG) Practice Bulletin 162, all pregnant women should be offered prenatal assessment for aneuploidy by diagnostic testing regardless of maternal age or other risk factors. If indicated, genetic testing that could be ordered on an amniocentesis sample includes a karyotype, microarray, and testing for specific syndromes. A karyotype can detect chromosomal aneuploidies as well as large deletions or duplications of chromosomal material and chromosomal rearrangements.  Microarray assesses for smaller pieces of chromosomal material that are missing or extra that fall below the detection range of a karyotype. These chromosomal changes can be associated with various microdeletion or microduplication syndromes that could have impacts on one's health or development. A microarray can also detect some microdeletion and microduplications that have unclear clinical relevance (variants of uncertain significance) and consanguinity.   Carrier Screening. Per the ACOG Committee Opinion 691, if an individual is found to be a carrier for a specific condition, the individual's reproductive partner should be offered testing in order to receive informed genetic counseling about potential reproductive outcomes. Genetic counseling offered carrier screening for Liany's reproductive partner given Mackenize's carrier status.     Patient Plan:  Proceed with: Routine prenatal care Informed consent was obtained. All questions were answered.  Declined: Carrier screening for Rasheedah's reproductive partner for alpha thalassemia   Thank you for sharing in the care of Jeanie with [place].  Please do not hesitate to contact us if you have any questions.  Teena Dunk, MS, [facility]

## 2022-12-09 ENCOUNTER — Ambulatory Visit (INDEPENDENT_AMBULATORY_CARE_PROVIDER_SITE_OTHER): Payer: 59 | Admitting: Obstetrics and Gynecology

## 2022-12-09 VITALS — BP 131/79 | HR 80 | Wt 193.0 lb

## 2022-12-09 DIAGNOSIS — D563 Thalassemia minor: Secondary | ICD-10-CM

## 2022-12-09 DIAGNOSIS — Z3400 Encounter for supervision of normal first pregnancy, unspecified trimester: Secondary | ICD-10-CM

## 2022-12-09 NOTE — Patient Instructions (Signed)
AREA PEDIATRIC/FAMILY PRACTICE PHYSICIANS  [place]outheast [place] ([address]) [facility] Chambliss, MD; Eniola, MD; Hale, MD; Hensel, MD; McDiarmid, MD; McIntyer, MD; Neal, MD; Walden, MD [address] [place], [place] [address] [phone] Mon-Fri 8:30-12:30, 1:30-5:00 Providers come to see babies at [facility] Accepting Medicaid [facility] providers who accept newborns: Koirala, MD; Morrow, MD; Wolters, MD [address], [place], [place] [address] [phone] Mon-Fri 8:00-5:30 Babies seen by providers at [facility] Does NOT accept Medicaid Please call early in hospitalization for appointment (limited availability)  [facility] Mulberry, MD [address] [place], [place] [address] [phone] Mon, Tue, Thur, Fri 8:30-5:00, Wed 10:00-7:00 (closed 1-2pm) Babies seen by [facility] providers Accepting Medicaid Rubin - Pediatrician Rubin, MD [address], [place], [place] [address] [phone] Mon-Fri 8:30-5:00, Sat 8:30-12:00 Provider comes to see babies at [facility] Accepting Medicaid Must have been referred from current patients or contacted office prior to delivery [facility] ([facility]) Brown, MD; Chandler, MD; Ettefagh, MD; Grant, MD; Lester, MD; McCormick, MD; McQueen, MD; Prose, MD; Simha, MD; Stanley, MD; Stryffeler, NP; Tebben, NP [address], [place], [place] [address] [phone] Mon, Tue, Thur, Fri 8:30-5:30, Wed 9:30-5:30, Sat 8:30-12:30 Babies seen by [facility] providers Accepting Medicaid Only accepting infants of first-time parents or siblings of current patients Hospital discharge coordinator will make follow-up appointment Jack Amos [address], [place], [place]  [address] [phone]   Fax - [phone] [facility] [address], [place], [place]  [address] Phone - [phone]   Fax - [phone] Shilpa Gosrani [address], [place], [place]  [address] [phone]  [place] ([address]) [facility] Bates, MD; Brassfield, MD; Cooper, Cox, MD; MD; Davis, MD; Dovico, MD; Ettefaugh, MD; Little, MD; Lowe, MD; Keiffer, MD; Melvin, MD; Sumner, MD; Williams, MD [address], [place], [place] [address] [phone] Mon-Fri 8:30-5:00 (extended evenings Mon-Thur as needed), Sat-Sun 10:00-1:00 Providers come to see babies at [facility] Accepting Medicaid for families of first-time babies and families with all children in the household age 3 and under. Must register with office prior to making appointment (M-F only). [facility] Henson, NP; Knapp, MD; Lalonde, MD; Tysinger, PA [address] [place], [place] [address] [phone] Mon-Fri 8:00-5:00 Babies seen by providers at [facility] Does NOT accept Medicaid/Commercial Insurance Only [facility] ([facility])  Artis, MD; Barnes, MD; Bratton, MD; Coccaro, MD; Lockett Gardner, MD; Kramer, MD; Marshall, MD; Netherton, MD; Poleto, MD; Skinner, MD [address] [place], [place] [address] [phone] Mon-Fri 8:30-5:30, Sat (Oct.-Mar.) 9:00-1:00 Babies seen by providers at [facility] Accepting Medicaid  [place] ([address]) [facility] Reid, MD; Warner, MD [address], [place], [place] [address] [phone] Mon-Fri 8:30-5:00, Sat 8:30-12:00 Providers come to see babies at [facility] Does NOT accept Medicaid [facility] Becker, PA; Hagler, MD; Scifres, PA; Sun, MD; Swayne, MD [address], [place],  [address] [phone] Mon-Fri 8:00-5:00 Babies seen by providers at [facility] Does NOT accept Medicaid Only accepting babies of parents who  are patients Please call early in hospitalization for appointment (limited availability)  Pediatricians Clark, MD; Frye, MD; Kelleher, MD; Mack, NP; Miller, MD; O'Keller, MD; Patterson, NP; Pudlo, MD; Puzio, MD; Thomas, MD; Tucker, MD; Twiselton, MD [address], [place], [place] [address] [phone] Mon-Fri 8:00-5:00, Sat 9:00-12:00 Providers come to see babies at [facility] Does NOT accept Medicaid  [place] ([address]) [facility] providers accepting new patients: Brake, NP; Wharton, PA [address], [place], [place] [address] [phone] Mon-Fri 8:00-5:00 Babies seen by providers at [facility] Does NOT accept Medicaid Only accepting babies of parents who are patients Please call early in hospitalization for appointment (limited availability) [facility] Gay, MD; Quinlan, MD [address] [place], [place] [address] [phone] (press 1 to schedule appointment) Mon-Fri 8:00-5:00 Providers come to see babies at [facility] Does NOT accept Medicaid [facility] Mazer, MD [address] [place], [place] [address] [phone] Mon-Fri 8:30-5:00 (lunch 12:30-1:00), extended hours by appointment only Wed 5:00-6:30 Babies seen by [facility] providers Accepting Medicaid [facility] Banks, MD; Jordan, MD; Koberlein, MD [address], [place], [place] [address] [phone] Mon-Fri 8:00-5:00 Babies seen by [facility] providers Does NOT accept Medicaid [facility] Parker, MD; Hunter, MD; Wallace, DO [address] [place], [place] [address] [phone] Mon-Fri 8:00-5:00 Babies seen by [facility] providers Does NOT accept Medicaid [facility] Brandon, PA; Brecken, PA; Christy, NP; Dees, MD; DeClaire, MD; DeWeese, MD; Hansen, NP; Mills, NP; Parrish, NP; Smoot, NP; Summer, MD; Vapne,  MD [address] [place], [place] [address] [phone] Mon-Fri 8:30-5:00, Sat 10:00-1:00 Providers come to see babies at [facility] Does NOT accept Medicaid Free prenatal information session Tuesdays at 4:45pm [facility] Bouska, MD; Gordon, PA; Jeffery, PA; Weber, PA [address] [place] [address] [phone] Mon-Fri 7:30-5:30 Babies seen by [facility] providers [facility] [address], [place], [place]  [address] [phone]   Fax - [phone]  [place] ([address] & [address]) [facility] [place] [address] [place], [place] [address] [phone] Mon-Thur 8:00-6:00 Providers come to see babies at [facility] Accepting Medicaid [facility] Anderson, NP; [place], MD; Beal, PA; Spencer, PA [address] [place], [place] [address] [phone] Mon-Thur 7:30-7:30, Fri 7:30-4:30 Babies seen by [facility] providers Accepting Medicaid [facility] Agbuya, MD; Klett, NP; Romgoolam, MD [address], [place], [place] [address] [phone] Mon-Fri 8:30-5:00, Sat 8:30-12:00 Providers come to see babies at [facility] Accepting Medicaid Must have "Meet & Greet" appointment at office prior to delivery [facility] ([facility] McCord, MD; Wallace, MD; Wood, MD [address], [place], [place] [address] [phone] Mon-Wed 8:00-6:00, Thur-Fri 8:00-5:00, Sat 9:00-12:00 Providers come to see babies at [facility] Does NOT accept Medicaid Only accepting siblings of current patients [facility]  [address], [place], [place]  [address] [phone]   Fax - [phone] [facility] [address], [place], [place]  [address] [phone]   Fax -  [phone]  [place] ([address] & [address]) [facility] Cirigliano, DO; Matthews, DO [address] [place], [place] [address] [phone] Mon-Fri 7:00-5:00 Babies seen by [facility] providers Does NOT accept Medicaid [facility] Briscoe, MD; Howley, PA; Moreira, PA [address], [place], [place] [address] [phone] Mon-Fri 8:00-5:00 Babies seen by [facility] providers Accepting Medicaid [facility] Boyd, MD; Church, PA; Jones, NP; Osborn, PA [address], [place], [place] [address] [phone] Mon-Fri 8:00-5:00 Babies seen by providers at [facility] Accepting Medicaid  [place] ([address]) [facility] [place] Wendling, DO [address] [place], [place] [address] [phone] Mon-Fri 8:00-5:00 Babies seen by [facility] providers Does NOT accept Medicaid Limited availability, please call early in hospitalization to schedule follow-up [facility] Calderon, PA; Cummings, MD; Dillard, MD; Martin, PA; Olson, MD; VanDeven, PA [address], [place], [place] [address] [phone] Mon-Fri 8:30-5:00, Sat 9:00-12:00 Babies seen by providers at [facility] Accepting Medicaid Please register online then schedule online or call office [email or web address]) Hunter, NP; Kumar, MD; Martin Rogers, PA [address], [place], [place] [address] [phone] Mon-Fri 8:00-5:00 Babies seen by providers at [facility] Accepting Medicaid [facility] ([facility]) [place], MD; Kristi Fleenor, NP; West, MD [address], [place], [place] [address] [phone] Mon-Fri 8:00-5:30, Sat&Sun by appointment (phones open at  8:30) Babies seen by [facility] providers Accepting Medicaid Must be a first-time baby or sibling of current patient [facility] [place]  [address], [place], [place]  [address] [phone]   Fax - [phone]  [place] ([address]) [facility] Brown, PA; Cowen, PA; Rice, MD; Helton, PA; Spry, MD [address] [place], [place] [address] [phone] Mon-Thur 8:00-7:00, Fri 8:00-5:00, Sat 8:00-12:00, Sun 9:00-12:00 Babies seen by [facility] providers Accepting Medicaid [facility] Coe-Goins, MD; Marshall, MD; Pierre-Louis, MD [address] [place] [address], [place], [place] [address] [phone] Mon-Thur 8:00-5:00 Babies seen by providers at [facility] Accepting Medicaid [facility] Bratton, MD; Coe-Goins, MD; Hayes, MD; Lewis, MD; List, MD; Lott, MD; Marshall, MD; Moran, MD; O'Neal, MD; Pierre-Louis, MD; Pitonzo, MD; Scholer, MD; Spangle, MD [address] [place], [place] [address] [phone] Mon-Fri 8:00-5:30, Sat (Oct.-Mar.) 9:00-1:00 Babies seen by providers at [facility] Accepting Medicaid Must fill out new patient packet, available online at [email or web address] [facility] ([facility]) Friddle, NP; Harris, NP; Kelly, NP; Logan, MD; Melvin, PA; Poth, MD; Ramadoss, MD; Stanton, NP [address], [place], [place] [address] [phone] Mon-Thur 8:00-5:30, Fri 8:00-5:00 Babies seen by providers at [facility] Accepting Medicaid  [place] ([address]) [facility] Dixon, PA; Henagar, MD; Pickard, MD; Tapia, PA [address], [place], [place] [address] [phone] Mon-Fri 8:00-5:00 Babies seen by providers at [facility] Accepting Medicaid   [place] ([address]) [facility] [place] Masneri, DO; Meyers, MD; Nelson, PA [address], [place], [place] [address] [phone] Mon-Fri 8:00-5:00 Babies seen by providers at [facility] Does NOT accept Medicaid Limited appointment availability, please call early in hospitalization  [facility] [place] Kunedd, DO; McGowen, MD [address], [place], [place] [address] [phone] Mon-Fri 8:00-5:00 Babies seen by [facility] providers Does NOT accept Medicaid [facility] [place] Cameron, MD; MacDonald, MD; Michaels, PA; Nayak, MD [address] [place] [address], [place],  [address] [phone] Mon-Fri 8:00-5:00 After hours clinic ([address] [place],  [address]) [phone] Mon-Fri 5:00-8:00, Sat 12:00-6:00, Sun 10:00-4:00 Babies seen by [facility] providers Accepting Medicaid [facility] [place] [address], [place], [place]  [address] [phone]   Fax - [phone]  [place] ([address]) [facility] [place] [facility] Andy, MD [address], [place], [place] [address] [phone] Mon-Fri 8:00-5:00 Babies seen by [facility] providers Does NOT accept Medicaid [facility] [place] ([facility] [place]) Eksir, MD [address], [place], [place] [address] [phone] Mon-Thur 8:00-7:00, Fri 8:00-5:00, Sat 8:00-12:00 Babies seen by providers at [facility] Accepting Medicaid - but does not have vaccinations in office (must be received elsewhere) Limited availability, please call early in hospitalization  [place] ([address]) [facility]  Charlene Flemming, MD [address],  [place] [address] [phone]  Fax [phone]  [facility]  Kimberly Newton, MD, Annamarie Streilein, PA, Carla Hampton, PA [address] [place], [place]  [address] [phone] [facility]  [address], [place], [place] [address] [phone] [address], [place], [place] [address] [phone] (West Office)  [facility] [address], [place], [place] [address] [phone] [facility] [address], [place], [place] [address] [phone] [facility] [address], [place], [place] [address] [phone] [facility] [address], [place], [place] [address] [phone] [facility] [address], [place], [place] [address] [phone] [facility] [address], [place], [place] [address] [phone] [facility]  [address], [place], [place] [address] [phone] Dr[facility] [address] [place] [address], [place], [place] [address] [phone] [facility] [address], [address], [place],  [address] [phone] [facility] [address], ,  [address] [phone]  

## 2022-12-09 NOTE — Progress Notes (Addendum)
   PRENATAL VISIT NOTE  Subjective:  Melissa Sheppard is a 31 y.o. G2P0010 at [redacted]w[redacted]d being seen today for ongoing prenatal care.  She is currently monitored for the following issues for this low-risk pregnancy and has Right shoulder pain; Hidradenitis suppurativa; Supervision of normal pregnancy; and Alpha thalassemia silent carrier on their problem list.  Patient reports no complaints. Mild GERD symptoms. Contractions: Not present. Vag. Bleeding: None.  Movement: Present. Denies leaking of fluid.   The following portions of the patient's history were reviewed and updated as appropriate: allergies, current medications, past family history, past medical history, past social history, past surgical history and problem list.   Objective:   Vitals:   12/09/22 0815  BP: 131/79  Pulse: 80  Weight: 193 lb (87.5 kg)    Fetal Status: Fetal Heart Rate (bpm): 153   Movement: Present     General:  Alert, oriented and cooperative. Patient is in no acute distress.  Skin: Skin is warm and dry. No rash noted.   Cardiovascular: Normal heart rate noted  Respiratory: Normal respiratory effort, no problems with respiration noted  Abdomen: Soft, gravid, appropriate for gestational age.  Pain/Pressure: Absent      Assessment and Plan:  Pregnancy: G2P0010 at [redacted]w[redacted]d 1. Supervision of normal first pregnancy, antepartum Continue ldASA - pt taking Reviewed normal AFP and anatomy US - some nonvis anatomy, f/u scheduled 1/10 Plans to try breast feeding PPBC Nexplanon Peds list given  2. Alpha thalassemia silent carrier S/p genetic counseling, declines partner testing  Please refer to After Visit Summary for other counseling recommendations.   Return in about 4 weeks (around 01/06/2023) for return OB.  Future Appointments  Date Time Provider Department Center  01/05/2023  3:30 PM [facility] NURSE [facility]  01/05/2023  3:45 PM [facility] US5 [facility]  01/06/2023  9:10 AM [name], MD [facility]  [facility]   Lennart Pall, MD

## 2022-12-27 NOTE — L&D Delivery Note (Addendum)
Delivery Note Called to room and patient was complete and pushing. Head delivered. No nuchal cord present. Shoulder and body delivered in usual fashion. At 1737 a viable female was delivered via Vaginal, Spontaneous (Presentation: LOA).  Infant with spontaneous cry, placed on mother's abdomen, dried and stimulated. Cord clamped x 2 after 1-minute delay, and cut by FOB. Cord blood drawn. Placenta delivered spontaneously with gentle cord traction. Appears intact. Fundus firm with massage and Pitocin. Labia, perineum, vagina, and cervix inspected with perineal laceration and labial laceration.    APGAR: 8, 9; weight pending  .   Cord: 3VC with the following complications: none.   Cord pH: none  Anesthesia:  Epidural and lidocaine Episiotomy: None Lacerations: 2nd degree perineal  Suture Repair: 3.0 vicryl rapide Est. Blood Loss (mL): 98  Mom to postpartum.  Baby to Couplet care / Skin to Skin.  Gilles Chiquito, MD PGY-3 6:16 PM      I was gloved and present for entire delivery SVD without incident No difficulty with shoulders Lacerations as listed above Repair of same supervised by me  Clayton Bibles, CNM 04/19/23 6:32 PM

## 2023-01-05 ENCOUNTER — Ambulatory Visit: Payer: 59 | Attending: Obstetrics

## 2023-01-05 ENCOUNTER — Ambulatory Visit: Payer: 59 | Admitting: *Deleted

## 2023-01-05 VITALS — BP 129/60 | HR 68

## 2023-01-05 DIAGNOSIS — Z3A24 24 weeks gestation of pregnancy: Secondary | ICD-10-CM | POA: Diagnosis not present

## 2023-01-05 DIAGNOSIS — Z362 Encounter for other antenatal screening follow-up: Secondary | ICD-10-CM | POA: Insufficient documentation

## 2023-01-05 DIAGNOSIS — D563 Thalassemia minor: Secondary | ICD-10-CM | POA: Diagnosis present

## 2023-01-05 DIAGNOSIS — Z3400 Encounter for supervision of normal first pregnancy, unspecified trimester: Secondary | ICD-10-CM | POA: Insufficient documentation

## 2023-01-05 DIAGNOSIS — Z363 Encounter for antenatal screening for malformations: Secondary | ICD-10-CM | POA: Diagnosis not present

## 2023-01-05 DIAGNOSIS — O321XX0 Maternal care for breech presentation, not applicable or unspecified: Secondary | ICD-10-CM | POA: Insufficient documentation

## 2023-01-05 DIAGNOSIS — O285 Abnormal chromosomal and genetic finding on antenatal screening of mother: Secondary | ICD-10-CM | POA: Diagnosis not present

## 2023-01-05 NOTE — Progress Notes (Unsigned)
   PRENATAL VISIT NOTE  Subjective:  Melissa Sheppard is a 32 y.o. G2P0010 at [redacted]w[redacted]d being seen today for ongoing prenatal care.  She is currently monitored for the following issues for this low-risk pregnancy and has Hidradenitis suppurativa; Supervision of normal pregnancy; and Alpha thalassemia silent carrier on their problem list.  Patient reports no complaints.  Contractions: Not present. Vag. Bleeding: None.  Movement: Present. Denies leaking of fluid.   The following portions of the patient's history were reviewed and updated as appropriate: allergies, current medications, past family history, past medical history, past social history, past surgical history and problem list.   Objective:   Vitals:   01/06/23 0914  BP: 116/70  Pulse: 78  Weight: 200 lb (90.7 kg)  TWG 21 lb  Fetal Status: Fetal Heart Rate (bpm): 155 Fundal Height: 24 cm Movement: Present     General:  Alert, oriented and cooperative. Patient is in no acute distress.  Skin: Skin is warm and dry. No rash noted.   Cardiovascular: Normal heart rate noted  Respiratory: Normal respiratory effort, no problems with respiration noted  Abdomen: Soft, gravid, appropriate for gestational age.  Pain/Pressure: Absent     Pelvic: Cervical exam deferred        Extremities: Normal range of motion.  Edema: None  Mental Status: Normal mood and affect. Normal behavior. Normal judgment and thought content.   Assessment and Plan:  Pregnancy: G2P0010 at [redacted]w[redacted]d 1. Supervision of normal first pregnancy, antepartum Had f/u anatomy yesterday - was not fully visualized of the outflow tracts. Nothing appeared abnormal however which was good.  Rh positive Tdap next time MOF: Will try breast but open to bottle  MOC: Nexplanon  2. Alpha thalassemia silent carrier Pt declined partner testing  Preterm labor symptoms and general obstetric precautions including but not limited to vaginal bleeding, contractions, leaking of fluid and fetal  movement were reviewed in detail with the patient. Please refer to After Visit Summary for other counseling recommendations.   Return in about 4 weeks (around 02/03/2023) for 2 hr GTT, OB VISIT, MD or APP.  Future Appointments  Date Time Provider [place]  02/02/2023  3:15 PM [facility] NURSE [facility]  02/02/2023  3:30 PM [facility] US2 [facility]    Radene Gunning, MD

## 2023-01-06 ENCOUNTER — Ambulatory Visit (INDEPENDENT_AMBULATORY_CARE_PROVIDER_SITE_OTHER): Payer: 59 | Admitting: Obstetrics and Gynecology

## 2023-01-06 ENCOUNTER — Encounter: Payer: Self-pay | Admitting: Obstetrics and Gynecology

## 2023-01-06 ENCOUNTER — Other Ambulatory Visit: Payer: Self-pay | Admitting: *Deleted

## 2023-01-06 VITALS — BP 116/70 | HR 78 | Wt 200.0 lb

## 2023-01-06 DIAGNOSIS — O99112 Other diseases of the blood and blood-forming organs and certain disorders involving the immune mechanism complicating pregnancy, second trimester: Secondary | ICD-10-CM

## 2023-01-06 DIAGNOSIS — Z3A24 24 weeks gestation of pregnancy: Secondary | ICD-10-CM

## 2023-01-06 DIAGNOSIS — Z362 Encounter for other antenatal screening follow-up: Secondary | ICD-10-CM

## 2023-01-06 DIAGNOSIS — Z3400 Encounter for supervision of normal first pregnancy, unspecified trimester: Secondary | ICD-10-CM

## 2023-01-06 DIAGNOSIS — D563 Thalassemia minor: Secondary | ICD-10-CM

## 2023-02-02 ENCOUNTER — Ambulatory Visit: Payer: 59

## 2023-02-02 ENCOUNTER — Ambulatory Visit: Payer: 59 | Attending: Maternal & Fetal Medicine

## 2023-02-02 VITALS — BP 140/77 | HR 92

## 2023-02-02 DIAGNOSIS — Z3400 Encounter for supervision of normal first pregnancy, unspecified trimester: Secondary | ICD-10-CM | POA: Insufficient documentation

## 2023-02-02 DIAGNOSIS — O285 Abnormal chromosomal and genetic finding on antenatal screening of mother: Secondary | ICD-10-CM

## 2023-02-02 DIAGNOSIS — Z362 Encounter for other antenatal screening follow-up: Secondary | ICD-10-CM | POA: Insufficient documentation

## 2023-02-02 DIAGNOSIS — Z3A28 28 weeks gestation of pregnancy: Secondary | ICD-10-CM | POA: Insufficient documentation

## 2023-02-02 DIAGNOSIS — D563 Thalassemia minor: Secondary | ICD-10-CM

## 2023-02-04 ENCOUNTER — Ambulatory Visit (INDEPENDENT_AMBULATORY_CARE_PROVIDER_SITE_OTHER): Payer: 59 | Admitting: Obstetrics and Gynecology

## 2023-02-04 VITALS — BP 111/73 | HR 82 | Wt 207.0 lb

## 2023-02-04 DIAGNOSIS — Z23 Encounter for immunization: Secondary | ICD-10-CM | POA: Diagnosis not present

## 2023-02-04 DIAGNOSIS — Z3400 Encounter for supervision of normal first pregnancy, unspecified trimester: Secondary | ICD-10-CM

## 2023-02-04 DIAGNOSIS — Z3482 Encounter for supervision of other normal pregnancy, second trimester: Secondary | ICD-10-CM

## 2023-02-04 DIAGNOSIS — Z3A28 28 weeks gestation of pregnancy: Secondary | ICD-10-CM

## 2023-02-04 NOTE — Progress Notes (Signed)
   PRENATAL VISIT NOTE  Subjective:  Melissa Sheppard is a 32 y.o. G2P0010 at [redacted]w[redacted]d being seen today for ongoing prenatal care.  She is currently monitored for the following issues for this low-risk pregnancy and has Hidradenitis suppurativa; Supervision of normal pregnancy; and Alpha thalassemia silent carrier on their problem list.  Patient reports no complaints.  Contractions: Not present. Vag. Bleeding: None.  Movement: Present. Denies leaking of fluid.   The following portions of the patient's history were reviewed and updated as appropriate: allergies, current medications, past family history, past medical history, past social history, past surgical history and problem list.   Objective:   Vitals:   02/04/23 0810  BP: 111/73  Pulse: 82  Weight: 207 lb (93.9 kg)    Fetal Status: Fetal Heart Rate (bpm): 144 Fundal Height: 29 cm Movement: Present     General:  Alert, oriented and cooperative. Patient is in no acute distress.  Skin: Skin is warm and dry. No rash noted.   Cardiovascular: Normal heart rate noted  Respiratory: Normal respiratory effort, no problems with respiration noted  Abdomen: Soft, gravid, appropriate for gestational age.  Pain/Pressure: Absent     Pelvic: Cervical exam deferred        Extremities: Normal range of motion.  Edema: None  Mental Status: Normal mood and affect. Normal behavior. Normal judgment and thought content.   Assessment and Plan:  Pregnancy: G2P0010 at [redacted]w[redacted]d 1. Supervision of normal first pregnancy, antepartum  - Continue BASA - BP good - Reviewed reasons to visit MAU. - HIV antibody (with reflex) - CBC - RPR - Glucose Tolerance, 2 Hours w/1 Hour - Tdap vaccine greater than or equal to 7yo IM  Preterm labor symptoms and general obstetric precautions including but not limited to vaginal bleeding, contractions, leaking of fluid and fetal movement were reviewed in detail with the patient. Please refer to After Visit Summary for  other counseling recommendations.   No follow-ups on file.  Future Appointments  Date Time Provider [place]  02/17/2023  8:10 AM Inez Catalina, MD [facility]    Noni Saupe, NP

## 2023-02-05 LAB — CBC
Hematocrit: 33.3 % — ABNORMAL LOW (ref 34.0–46.6)
Hemoglobin: 10.6 g/dL — ABNORMAL LOW (ref 11.1–15.9)
MCH: 25.8 pg — ABNORMAL LOW (ref 26.6–33.0)
MCHC: 31.8 g/dL (ref 31.5–35.7)
MCV: 81 fL (ref 79–97)
Platelets: 167 10*3/uL (ref 150–450)
RBC: 4.11 x10E6/uL (ref 3.77–5.28)
RDW: 11.9 % (ref 11.7–15.4)
WBC: 9.9 10*3/uL (ref 3.4–10.8)

## 2023-02-05 LAB — GLUCOSE TOLERANCE, 2 HOURS W/ 1HR
Glucose, 1 hour: 139 mg/dL (ref 70–179)
Glucose, 2 hour: 116 mg/dL (ref 70–152)
Glucose, Fasting: 74 mg/dL (ref 70–91)

## 2023-02-05 LAB — HIV ANTIBODY (ROUTINE TESTING W REFLEX): HIV Screen 4th Generation wRfx: NONREACTIVE

## 2023-02-05 LAB — RPR: RPR Ser Ql: NONREACTIVE

## 2023-02-17 ENCOUNTER — Ambulatory Visit (INDEPENDENT_AMBULATORY_CARE_PROVIDER_SITE_OTHER): Payer: 59 | Admitting: Obstetrics and Gynecology

## 2023-02-17 VITALS — BP 127/77 | HR 81 | Wt 209.0 lb

## 2023-02-17 DIAGNOSIS — Z3483 Encounter for supervision of other normal pregnancy, third trimester: Secondary | ICD-10-CM

## 2023-02-17 DIAGNOSIS — Z3400 Encounter for supervision of normal first pregnancy, unspecified trimester: Secondary | ICD-10-CM

## 2023-02-17 DIAGNOSIS — D563 Thalassemia minor: Secondary | ICD-10-CM

## 2023-02-17 DIAGNOSIS — Z3A30 30 weeks gestation of pregnancy: Secondary | ICD-10-CM

## 2023-02-17 NOTE — Progress Notes (Signed)
   PRENATAL VISIT NOTE  Subjective:  Melissa Sheppard is a 32 y.o. G2P0010 at 67w1dbeing seen today for ongoing prenatal care.  She is currently monitored for the following issues for this low-risk pregnancy and has Hidradenitis suppurativa; Supervision of normal pregnancy; and Alpha thalassemia silent carrier on their problem list.  Patient reports no complaints. Starting to feel more pelvic pressure. Contractions: Not present. Vag. Bleeding: None.  Movement: Present. Denies leaking of fluid.   The following portions of the patient's history were reviewed and updated as appropriate: allergies, current medications, past family history, past medical history, past social history, past surgical history and problem list.   Objective:   Vitals:   02/17/23 0807  BP: 127/77  Pulse: 81  Weight: 209 lb (94.8 kg)    Fetal Status: Fetal Heart Rate (bpm): 150 Fundal Height: 30 cm Movement: Present     General:  Alert, oriented and cooperative. Patient is in no acute distress.  Skin: Skin is warm and dry. No rash noted.   Cardiovascular: Normal heart rate noted  Respiratory: Normal respiratory effort, no problems with respiration noted  Abdomen: Soft, gravid, appropriate for gestational age.  Pain/Pressure: Absent      Assessment and Plan:  Pregnancy: G2P0010 at 317w1d. Supervision of normal first pregnancy, antepartum 2. [redacted] weeks gestation of pregnancy Doing well, FH/FHT normal today Discussed pediatrician, child care Reviewed PTL symptoms  3. Alpha thalassemia silent carrier Declined partner testing  Return in about 2 weeks (around 03/03/2023) for return OB.  Future Appointments  Date Time Provider [place]3/06/2023  1:50 PM DuRadene GunningMD [facility] KyInez CatalinaMD

## 2023-02-21 ENCOUNTER — Encounter (HOSPITAL_COMMUNITY): Payer: Self-pay | Admitting: Family Medicine

## 2023-02-21 ENCOUNTER — Telehealth: Payer: Self-pay

## 2023-02-21 ENCOUNTER — Inpatient Hospital Stay (HOSPITAL_COMMUNITY)
Admission: AD | Admit: 2023-02-21 | Discharge: 2023-02-21 | Disposition: A | Discharge status: Home / Self Care | Payer: 59 | Attending: Family Medicine | Admitting: Family Medicine

## 2023-02-21 ENCOUNTER — Inpatient Hospital Stay (HOSPITAL_COMMUNITY): Payer: 59

## 2023-02-21 DIAGNOSIS — O26893 Other specified pregnancy related conditions, third trimester: Secondary | ICD-10-CM | POA: Diagnosis present

## 2023-02-21 DIAGNOSIS — R0602 Shortness of breath: Secondary | ICD-10-CM | POA: Diagnosis not present

## 2023-02-21 DIAGNOSIS — Z3A30 30 weeks gestation of pregnancy: Secondary | ICD-10-CM | POA: Diagnosis not present

## 2023-02-21 LAB — URINALYSIS, ROUTINE W REFLEX MICROSCOPIC
Bilirubin Urine: NEGATIVE
Glucose, UA: NEGATIVE mg/dL
Hgb urine dipstick: NEGATIVE
Ketones, ur: NEGATIVE mg/dL
Leukocytes,Ua: NEGATIVE
Nitrite: NEGATIVE
Protein, ur: NEGATIVE mg/dL
Specific Gravity, Urine: <1.005 — ABNORMAL LOW (ref 1.005–1.030)
pH: 7.5 (ref 5.0–8.0)

## 2023-02-21 LAB — COMPREHENSIVE METABOLIC PANEL
ALT: 13 U/L (ref 0–44)
AST: 14 U/L — ABNORMAL LOW (ref 15–41)
Albumin: 2.7 g/dL — ABNORMAL LOW (ref 3.5–5.0)
Alkaline Phosphatase: 107 U/L (ref 38–126)
Anion gap: 7 (ref 5–15)
BUN: 7 mg/dL (ref 6–20)
CO2: 24 mmol/L (ref 22–32)
Calcium: 9.2 mg/dL (ref 8.9–10.3)
Chloride: 103 mmol/L (ref 98–111)
Creatinine, Ser: 0.64 mg/dL (ref 0.44–1.00)
GFR, Estimated: >60 mL/min (ref >60)
Glucose, Bld: 79 mg/dL (ref 70–99)
Potassium: 3.9 mmol/L (ref 3.5–5.1)
Sodium: 134 mmol/L — ABNORMAL LOW (ref 135–145)
Total Bilirubin: 0.4 mg/dL (ref 0.3–1.2)
Total Protein: 6.1 g/dL — ABNORMAL LOW (ref 6.5–8.1)

## 2023-02-21 LAB — CBC
HCT: 33.6 % — ABNORMAL LOW (ref 36.0–46.0)
Hemoglobin: 11 g/dL — ABNORMAL LOW (ref 12.0–15.0)
MCH: 26.3 pg (ref 26.0–34.0)
MCHC: 32.7 g/dL (ref 30.0–36.0)
MCV: 80.2 fL (ref 80.0–100.0)
Platelets: 249 10*3/uL (ref 150–400)
RBC: 4.19 MIL/uL (ref 3.87–5.11)
RDW: 13 % (ref 11.5–15.5)
WBC: 9.9 10*3/uL (ref 4.0–10.5)
nRBC: 0 % (ref 0.0–0.2)

## 2023-02-21 LAB — D-DIMER, QUANTITATIVE: D-Dimer, Quant: 0.57 ug/mL-FEU — ABNORMAL HIGH (ref 0.00–0.50)

## 2023-02-21 NOTE — MAU Provider Note (Signed)
History     [id number]  Arrival date and time: 02/21/23 1107    Chief Complaint  Patient presents with   Foot Swelling   Shortness of Breath     HPI Melissa Sheppard is a 32 y.o. at 48w5dwho presents for LE swelling & shortness of breath. Noticed both feet & ankles were swollen last night & had difficulty fitting into her shoes this morning. While getting ready for work this morning she felt short of breath & like her heart was racing. SOB resolved just prior to [place] Denies fever, chest pain, cough, n/v, calf pain. No history of asthma, VTE, or smoking. No OB complaints. Good fetal movement.   OB History     Gravida  2   Para  0   Term      Preterm      AB  1   Living         SAB  1   IAB      Ectopic      Multiple      Live Births              Past Medical History:  Diagnosis Date   No pertinent past medical history     Past Surgical History:  Procedure Laterality Date   NO PAST SURGERIES      Family History  Problem Relation Age of Onset   Stroke Mother    Diabetes Father    Stroke Maternal Uncle    Stroke Maternal Grandmother    Breast cancer Cousin    Hypertension Other    Asthma Neg Hx    Cancer Neg Hx     Allergies  Allergen Reactions   Amoxicillin Rash    No current facility-administered medications on file prior to encounter.   Current Outpatient Medications on File Prior to Encounter  Medication Sig Dispense Refill   Prenatal Vit-Fe Fumarate-FA (PRENATAL VITAMIN PO) Take by mouth.       ROS Pertinent positives and negative per HPI, all others reviewed and negative  Physical Exam   BP 120/75 (BP Location: Right Arm)   Pulse 79   Temp 97.7 F (36.5 C) (Oral)   Resp 18   Ht '5\' 8"'$  (1.727 m)   Wt 95.7 kg   LMP 07/21/2022   SpO2 100%   BMI 32.08 kg/m   Patient Vitals for the past 24 hrs:  BP Temp Temp src Pulse Resp SpO2 Height Weight  02/21/23 1503 120/75 97.7 F  (36.5 C) Oral 79 18 100 % -- --  02/21/23 1215 120/66 -- -- 80 -- 99 % -- --  02/21/23 1200 126/71 -- -- 78 -- 100 % -- --  02/21/23 1155 -- -- -- -- -- 100 % -- --  02/21/23 1150 -- -- -- -- -- 99 % -- --  02/21/23 1145 131/80 -- -- 86 -- -- -- --  02/21/23 1144 122/77 -- -- 85 -- -- -- --  02/21/23 1137 121/75 -- -- 80 -- -- -- --  02/21/23 1136 121/75 98.6 F (37 C) Oral 80 18 -- '5\' 8"'$  (1.727 m) 95.7 kg    Physical Exam Vitals and nursing note reviewed.  Constitutional:      General: She is not in acute distress.    Appearance: She is well-developed. She is not ill-appearing or diaphoretic.  HENT:     Head: Normocephalic and atraumatic.  Eyes:     General: No  scleral icterus.       Right eye: No discharge.        Left eye: No discharge.     Conjunctiva/sclera: Conjunctivae normal.  Cardiovascular:     Rate and Rhythm: Normal rate and regular rhythm.     Heart sounds: Normal heart sounds.  Pulmonary:     Effort: Pulmonary effort is normal. No tachypnea, accessory muscle usage or respiratory distress.     Breath sounds: Normal breath sounds. No decreased breath sounds or wheezing.  Musculoskeletal:     Right lower leg: No edema.     Left lower leg: No edema.  Skin:    General: Skin is warm and dry.  Neurological:     General: No focal deficit present.     Mental Status: She is alert.  Psychiatric:        Mood and Affect: Mood normal.        Behavior: Behavior normal.      FHT Baseline 140, moderate variability, 15x15 accels, no decels Toco: none Cat: 1  Labs Results for orders placed or performed during the hospital encounter of 02/21/23 (from the past 24 hour(s))  CBC     Status: Abnormal   Collection Time: 02/21/23 12:32 PM  Result Value Ref Range   WBC 9.9 4.0 - 10.5 K/uL   RBC 4.19 3.87 - 5.11 MIL/uL   Hemoglobin 11.0 (L) 12.0 - 15.0 g/dL   HCT 33.6 (L) 36.0 - 46.0 %   MCV 80.2 80.0 - 100.0 fL   MCH 26.3 26.0 - 34.0 pg   MCHC 32.7 30.0 - 36.0 g/dL    RDW 13.0 11.5 - 15.5 %   Platelets 249 150 - 400 K/uL   nRBC 0.0 0.0 - 0.2 %  Comprehensive metabolic panel     Status: Abnormal   Collection Time: 02/21/23 12:32 PM  Result Value Ref Range   Sodium 134 (L) 135 - 145 mmol/L   Potassium 3.9 3.5 - 5.1 mmol/L   Chloride 103 98 - 111 mmol/L   CO2 24 22 - 32 mmol/L   Glucose, Bld 79 70 - 99 mg/dL   BUN 7 6 - 20 mg/dL   Creatinine, Ser 0.64 0.44 - 1.00 mg/dL   Calcium 9.2 8.9 - 10.3 mg/dL   Total Protein 6.1 (L) 6.5 - 8.1 g/dL   Albumin 2.7 (L) 3.5 - 5.0 g/dL   AST 14 (L) 15 - 41 U/L   ALT 13 0 - 44 U/L   Alkaline Phosphatase 107 38 - 126 U/L   Total Bilirubin 0.4 0.3 - 1.2 mg/dL   GFR, Estimated >60 >60 mL/min   Anion gap 7 5 - 15  D-dimer, quantitative     Status: Abnormal   Collection Time: 02/21/23 12:32 PM  Result Value Ref Range   D-Dimer, Quant 0.57 (H) 0.00 - 0.50 ug/mL-FEU  Urinalysis, Routine w reflex microscopic -Urine, Clean Catch     Status: Abnormal   Collection Time: 02/21/23 12:45 PM  Result Value Ref Range   Color, Urine YELLOW YELLOW   APPearance CLEAR CLEAR   Specific Gravity, Urine <1.005 (L) 1.005 - 1.030   pH 7.5 5.0 - 8.0   Glucose, UA NEGATIVE NEGATIVE mg/dL   Hgb urine dipstick NEGATIVE NEGATIVE   Bilirubin Urine NEGATIVE NEGATIVE   Ketones, ur NEGATIVE NEGATIVE mg/dL   Protein, ur NEGATIVE NEGATIVE mg/dL   Nitrite NEGATIVE NEGATIVE   Leukocytes,Ua NEGATIVE NEGATIVE   RBC / HPF 0-5 0 - 5 RBC/hpf  WBC, UA 0-5 0 - 5 WBC/hpf   Bacteria, UA RARE (A) NONE SEEN   Squamous Epithelial / HPF 0-5 0 - 5 /HPF    Imaging DG Chest 2 View  Result Date: 02/21/2023 CLINICAL DATA:  Shortness of breath EXAM: CHEST - 2 VIEW COMPARISON:  None Available. FINDINGS: The heart size and mediastinal contours are within normal limits. Both lungs are clear. The visualized skeletal structures are unremarkable. IMPRESSION: No active cardiopulmonary disease. Electronically Signed   By: Marin Roberts M.D.   On: 02/21/2023 14:26     MAU Course  Procedures Lab Orders         Urinalysis, Routine w reflex microscopic -Urine, Clean Catch         CBC         Comprehensive metabolic panel         D-dimer, quantitative    No orders of the defined types were placed in this encounter.  Imaging Orders         DG Chest 2 View     MDM Reactive NST Vital signs & exam normal  Assessment and Plan   1. Shortness of breath due to pregnancy in third trimester  -Normal EKG & chest xray. SpO2 >99%. Patient currently asymptomatic. Reviewed labs & imaging with Dr. Nehemiah Settle. D-dimer appropriate for gestational age. Based on exam & results, low likelihood of DVT/PE.  -Reviewed reasons to return to MAU including unilateral LE swelling/pain, sudden onset SOB, or chest pain.   2. [redacted] weeks gestation of pregnancy  -Reactive NST. No OB complaints. Patient to keep scheduled OB follow up.      Jorje Guild, NP 02/21/23 3:17 PM

## 2023-02-21 NOTE — MAU Note (Signed)
.  Melissa Sheppard is a 32 y.o. at 31w5dhere in MAU reporting: noticed her feet and ankles seem more swollen . Still swollen today.  Felt a little SOB as well. Denies any headache or vision changes today. Stated she did see some spots on Friday but none since. Good fetal movement felt. Denies any abd pain or cramping or vag leaking or bleeding at this time. LMP:  Onset of complaint: last night Pain score: 0 Vitals:   02/21/23 1136  BP: 121/75  Pulse: 80  Resp: 18  Temp: 98.6 F (37 C)     FHT:147 Lab orders placed from triage:  u/a

## 2023-02-21 NOTE — Telephone Encounter (Signed)
Pt called stating that she is having increased swelling in feet and shortness of breath. Pt states the swelling started yesterday and the shortness of breath started today. Pt endorses fetal movement. Pt was advised to go to MAU to be evaluated for shortness of breath. Address to MAU given to pt. Pt states she will go be evaluated.

## 2023-02-21 NOTE — MAU Note (Signed)
Patient noticing pedal edema today different from her normal with this pregnancy.  She also noticed a bit of shortness of breath this morning at work.  Traveled to [place] by car over weekend.  Noticed a few spots in vision field on Friday.  Called [facility] office in [place] and was told to come here for evaluation.

## 2023-02-27 NOTE — Progress Notes (Unsigned)
   PRENATAL VISIT NOTE  Subjective:  Melissa Sheppard is a 32 y.o. G2P0010 at 75w1dbeing seen today for ongoing prenatal care.  She is currently monitored for the following issues for this low-risk pregnancy and has Hidradenitis suppurativa; Supervision of normal pregnancy; and Alpha thalassemia silent carrier on their problem list.  Patient reports no complaints.  Contractions: Not present. Vag. Bleeding: None.  Movement: Present. Denies leaking of fluid.   The following portions of the patient's history were reviewed and updated as appropriate: allergies, current medications, past family history, past medical history, past social history, past surgical history and problem list.   Objective:   Vitals:   03/03/23 1356  BP: 119/73  Pulse: 74  Weight: 213 lb (96.6 kg)    Fetal Status: Fetal Heart Rate (bpm): 142 Fundal Height: 32 cm Movement: Present     General:  Alert, oriented and cooperative. Patient is in no acute distress.  Skin: Skin is warm and dry. No rash noted.   Cardiovascular: Normal heart rate noted  Respiratory: Normal respiratory effort, no problems with respiration noted  Abdomen: Soft, gravid, appropriate for gestational age.  Pain/Pressure: Absent     Pelvic: Cervical exam deferred        Extremities: Normal range of motion.  Edema: Trace  Mental Status: Normal mood and affect. Normal behavior. Normal judgment and thought content.   Assessment and Plan:  Pregnancy: G2P0010 at 317w1d. Supervision of normal first pregnancy, antepartum Continue routine PNC. FH wnl.    Preterm labor symptoms and general obstetric precautions including but not limited to vaginal bleeding, contractions, leaking of fluid and fetal movement were reviewed in detail with the patient. Please refer to After Visit Summary for other counseling recommendations.   Return in about 2 weeks (around 03/17/2023) for LROB VISIT, MD or APP.  Future Appointments  Date Time Provider [place]3/21/2024  9:30 AM DuRadene GunningMD [facility]4/03/2023  8:10 AM OzJanyth PupaDO [facility]/10/2023  8:10 AM OzJanyth PupaDO [facility]/18/2024  8:10 AM AnHarolyn RutherfordUgSallyanne HaversMD [facility]4/25/2024  8:10 AM FoInez CatalinaMD [facility]  PaRadene GunningMD

## 2023-03-03 ENCOUNTER — Ambulatory Visit (INDEPENDENT_AMBULATORY_CARE_PROVIDER_SITE_OTHER): Payer: 59 | Admitting: Obstetrics and Gynecology

## 2023-03-03 ENCOUNTER — Encounter: Payer: Self-pay | Admitting: Obstetrics and Gynecology

## 2023-03-03 VITALS — BP 119/73 | HR 74 | Wt 213.0 lb

## 2023-03-03 DIAGNOSIS — Z3403 Encounter for supervision of normal first pregnancy, third trimester: Secondary | ICD-10-CM

## 2023-03-03 DIAGNOSIS — Z3A32 32 weeks gestation of pregnancy: Secondary | ICD-10-CM

## 2023-03-03 DIAGNOSIS — Z3400 Encounter for supervision of normal first pregnancy, unspecified trimester: Secondary | ICD-10-CM

## 2023-03-17 ENCOUNTER — Encounter: Payer: Self-pay | Admitting: Obstetrics and Gynecology

## 2023-03-17 ENCOUNTER — Ambulatory Visit (INDEPENDENT_AMBULATORY_CARE_PROVIDER_SITE_OTHER): Payer: Self-pay | Admitting: Obstetrics and Gynecology

## 2023-03-17 VITALS — BP 121/81 | HR 81 | Wt 216.0 lb

## 2023-03-17 DIAGNOSIS — D563 Thalassemia minor: Secondary | ICD-10-CM

## 2023-03-17 DIAGNOSIS — Z3A34 34 weeks gestation of pregnancy: Secondary | ICD-10-CM

## 2023-03-17 DIAGNOSIS — Z3400 Encounter for supervision of normal first pregnancy, unspecified trimester: Secondary | ICD-10-CM

## 2023-03-17 DIAGNOSIS — Z3483 Encounter for supervision of other normal pregnancy, third trimester: Secondary | ICD-10-CM

## 2023-03-17 NOTE — Patient Instructions (Signed)
Labor & Delivery  No children under the age of 87. Laboring women of any age may have one designated support person and one other visitor age 34 or older at a time, and a doula registered with [facility] for the labor and delivery phase of their stay. (Doulas not registered with [facility] are counted as visitors.) The visitor may switch with other visitors. Visitation is allowed 24 hours per day.  The designated support person or a visitor may stay overnight in the room.   Mother Baby Unit, OB Specialty and Gynecological Care  A designated support person and three visitors of any age may visit. The three visitors may switch out. The designated support person or a visitor age 42 or older may stay overnight in the room. During the postpartum period (up to 6 weeks), if the mother is the patient, she can have her newborn stay with her if there is another support person present who can be responsible for the baby.

## 2023-03-17 NOTE — Progress Notes (Signed)
   PRENATAL VISIT NOTE  Subjective:  Melissa Sheppard is a 32 y.o. G2P0010 at [redacted]w[redacted]d being seen today for ongoing prenatal care.  She is currently monitored for the following issues for this low-risk pregnancy and has Hidradenitis suppurativa; Supervision of normal pregnancy; and Alpha thalassemia silent carrier on their problem list.  Patient reports no complaints.  Contractions: Not present. Vag. Bleeding: None.  Movement: Present. Denies leaking of fluid.   The following portions of the patient's history were reviewed and updated as appropriate: allergies, current medications, past family history, past medical history, past social history, past surgical history and problem list.   Objective:   Vitals:   03/17/23 0931  BP: 121/81  Pulse: 81  Weight: 216 lb (98 kg)    Fetal Status: Fetal Heart Rate (bpm): 154   Movement: Present     General:  Alert, oriented and cooperative. Patient is in no acute distress.  Skin: Skin is warm and dry. No rash noted.   Cardiovascular: Normal heart rate noted  Respiratory: Normal respiratory effort, no problems with respiration noted  Abdomen: Soft, gravid, appropriate for gestational age.  Pain/Pressure: Absent     Pelvic: Cervical exam deferred        Extremities: Normal range of motion.  Edema: Trace  Mental Status: Normal mood and affect. Normal behavior. Normal judgment and thought content.   Assessment and Plan:  Pregnancy: G2P0010 at [redacted]w[redacted]d 1. Supervision of normal first pregnancy, antepartum Doing well. GBS next time  2. Alpha thalassemia silent carrier   Preterm labor symptoms and general obstetric precautions including but not limited to vaginal bleeding, contractions, leaking of fluid and fetal movement were reviewed in detail with the patient. Please refer to After Visit Summary for other counseling recommendations.   Return in about 2 weeks (around 03/31/2023) for LROB VISIT, MD or APP.  Future Appointments  Date Time Provider  [place]  03/31/2023  8:10 AM Janyth Pupa, DO [facility]  04/07/2023  8:10 AM Janyth Pupa, DO [facility]  04/14/2023  8:10 AM Anyanwu, Sallyanne Havers, MD [facility]  04/21/2023  8:10 AM Inez Catalina, MD [facility]    Radene Gunning, MD

## 2023-03-31 ENCOUNTER — Ambulatory Visit (INDEPENDENT_AMBULATORY_CARE_PROVIDER_SITE_OTHER): Payer: 59 | Admitting: Obstetrics & Gynecology

## 2023-03-31 ENCOUNTER — Encounter: Payer: Self-pay | Admitting: Obstetrics & Gynecology

## 2023-03-31 ENCOUNTER — Other Ambulatory Visit (HOSPITAL_COMMUNITY)
Admission: RE | Admit: 2023-03-31 | Discharge: 2023-03-31 | Disposition: A | Discharge status: Home / Self Care | Payer: 59 | Source: Ambulatory Visit | Attending: Obstetrics & Gynecology | Admitting: Obstetrics & Gynecology

## 2023-03-31 VITALS — BP 132/89 | HR 82 | Wt 220.0 lb

## 2023-03-31 DIAGNOSIS — Z3483 Encounter for supervision of other normal pregnancy, third trimester: Secondary | ICD-10-CM

## 2023-03-31 DIAGNOSIS — Z3A36 36 weeks gestation of pregnancy: Secondary | ICD-10-CM

## 2023-03-31 DIAGNOSIS — Z3400 Encounter for supervision of normal first pregnancy, unspecified trimester: Secondary | ICD-10-CM | POA: Insufficient documentation

## 2023-03-31 LAB — OB RESULTS CONSOLE GBS: GBS: NEGATIVE

## 2023-03-31 NOTE — Progress Notes (Signed)
  LOW-RISK PREGNANCY VISIT Patient name: Melissa Sheppard MRN [id number]  Date of birth: [birth year] Chief Complaint:   Routine Prenatal Visit  History of Present Illness:   Melissa Sheppard is a 32 y.o. G2P0010 female at [redacted]w[redacted]d with an Estimated Date of Delivery: 04/27/23 being seen today for ongoing management of a low-risk pregnancy.      02/04/2023    8:40 AM 09/14/2022   10:31 AM 07/29/2021    3:51 PM 06/02/2021    4:21 PM  Depression screen PHQ 2/9  Decreased Interest 0 0  1  Down, Depressed, Hopeless 0 0  2  PHQ - 2 Score 0 0  3  Altered sleeping 0 0  2  Tired, decreased energy 1 1  2   Change in appetite 0 0  3  Feeling bad or failure about yourself  0 0  3  Trouble concentrating 0 0  2  Moving slowly or fidgety/restless 0 0  0  Suicidal thoughts 0 0  0  PHQ-9 Score 1 1  15   Difficult doing work/chores    Not difficult at all     Information is confidential and restricted. Go to Review Flowsheets to unlock data.    Today she reports no complaints. Contractions: Irritability. Vag. Bleeding: None.  Movement: Present. denies leaking of fluid. Review of Systems:   Pertinent items are noted in HPI Denies abnormal vaginal discharge w/ itching/odor/irritation, headaches, visual changes, shortness of breath, chest pain, abdominal pain, severe nausea/vomiting, or problems with urination or bowel movements unless otherwise stated above. Pertinent History Reviewed:  Reviewed past medical,surgical, social, obstetrical and family history.  Reviewed problem list, medications and allergies.  Physical Assessment:   Vitals:   03/31/23 0811 03/31/23 0824  BP: 139/89 132/89  Pulse: 79 82  Weight: 220 lb (99.8 kg)   Body mass index is 33.45 kg/m.        Physical Examination:   General appearance: Well appearing, and in no distress  Mental status: Alert, oriented to person, place, and time  Skin: Warm & dry  Respiratory: Normal respiratory effort, no distress  Abdomen: Soft, gravid,  nontender  Pelvic: Cervical exam deferred vaginal cultures obtains        Extremities: Edema: Trace  Psych:  mood and affect appropriate  Fetal Status: Fetal Heart Rate (bpm): 150 Fundal Height: 37 cm Movement: Present    Chaperone:  Deanna Sole     No results found for this or any previous visit (from the past 24 hour(s)).   Assessment & Plan:  1) Low-risk pregnancy G2P0010 at [redacted]w[redacted]d with an Estimated Date of Delivery: 04/27/23   -PCN allergy- notes rash as child.  Denies anaphylactic reaction   Meds: No orders of the defined types were placed in this encounter.  Labs/procedures today: GBS with sensitivities  Plan:  Continue routine obstetrical care  Next visit: prefers in person    Reviewed: Preterm labor symptoms and general obstetric precautions including but not limited to vaginal bleeding, contractions, leaking of fluid and fetal movement were reviewed in detail with the patient.  All questions were answered.   Follow-up: Return in about 1 week (around 04/07/2023) for [place] visit.  Orders Placed This Encounter  Procedures   Culture, Grp B Strep w/Rflx Suscept    Janyth Pupa, DO Attending [place], [facility] for [employer], [place]

## 2023-04-01 LAB — CERVICOVAGINAL ANCILLARY ONLY
Chlamydia: NEGATIVE
Comment: NEGATIVE
Comment: NORMAL
Neisseria Gonorrhea: NEGATIVE

## 2023-04-03 LAB — CULTURE, STREPTOCOCCUS GRP B W/SUSCEPT
MICRO NUMBER:: 14782857
SPECIMEN QUALITY:: ADEQUATE

## 2023-04-05 ENCOUNTER — Ambulatory Visit (INDEPENDENT_AMBULATORY_CARE_PROVIDER_SITE_OTHER): Payer: Self-pay | Admitting: Pediatrics

## 2023-04-05 DIAGNOSIS — Z7681 Expectant parent(s) prebirth pediatrician visit: Secondary | ICD-10-CM

## 2023-04-06 ENCOUNTER — Encounter: Payer: Self-pay | Admitting: Obstetrics & Gynecology

## 2023-04-06 DIAGNOSIS — Z7681 Expectant parent(s) prebirth pediatrician visit: Secondary | ICD-10-CM | POA: Insufficient documentation

## 2023-04-06 NOTE — Progress Notes (Signed)
Prenatal counseling for impending newborn done-- Z76.81  

## 2023-04-07 ENCOUNTER — Encounter: Payer: Self-pay | Admitting: Obstetrics & Gynecology

## 2023-04-07 ENCOUNTER — Ambulatory Visit (INDEPENDENT_AMBULATORY_CARE_PROVIDER_SITE_OTHER): Payer: 59 | Admitting: Obstetrics & Gynecology

## 2023-04-07 VITALS — BP 115/71 | HR 102 | Wt 221.0 lb

## 2023-04-07 DIAGNOSIS — Z3483 Encounter for supervision of other normal pregnancy, third trimester: Secondary | ICD-10-CM

## 2023-04-07 NOTE — Progress Notes (Addendum)
  LOW-RISK PREGNANCY VISIT Patient name: Melissa Sheppard MRN [id number]  Date of birth: 1991-11-29 Chief Complaint:   Routine Prenatal Visit  History of Present Illness:   Melissa Sheppard is a 32 y.o. G2P0010 female at [redacted]w[redacted]d with an Estimated Date of Delivery: 04/27/23 being seen today for ongoing management of a low-risk pregnancy.      02/04/2023    8:40 AM 09/14/2022   10:31 AM 07/29/2021    3:51 PM 06/02/2021    4:21 PM  Depression screen PHQ 2/9  Decreased Interest 0 0  1  Down, Depressed, Hopeless 0 0  2  PHQ - 2 Score 0 0  3  Altered sleeping 0 0  2  Tired, decreased energy 1 1  2   Change in appetite 0 0  3  Feeling bad or failure about yourself  0 0  3  Trouble concentrating 0 0  2  Moving slowly or fidgety/restless 0 0  0  Suicidal thoughts 0 0  0  PHQ-9 Score 1 1  15   Difficult doing work/chores    Not difficult at all     Information is confidential and restricted. Go to Review Flowsheets to unlock data.    Today she reports  notes a little bit of brown spotting yesterday that has now resolved . Contractions: Irritability. Vag. Bleeding: None.  Movement: Present. denies leaking of fluid. Review of Systems:   Pertinent items are noted in HPI Denies abnormal vaginal discharge w/ itching/odor/irritation, headaches, visual changes, shortness of breath, chest pain, abdominal pain, severe nausea/vomiting, or problems with urination or bowel movements unless otherwise stated above. Pertinent History Reviewed:  Reviewed past medical,surgical, social, obstetrical and family history.  Reviewed problem list, medications and allergies.  Physical Assessment:   Vitals:   04/07/23 0819 04/07/23 0830  BP: 132/87 115/71  Pulse: 94 (!) 102  Weight: 221 lb (100.2 kg)   Body mass index is 33.6 kg/m.        Physical Examination:   General appearance: Well appearing, and in no distress  Mental status: Alert, oriented to person, place, and time  Skin: Warm & dry  Respiratory:  Normal respiratory effort, no distress  Abdomen: Soft, gravid, nontender  Pelvic: Cervical exam deferred         Extremities: Edema: Trace  Psych:  mood and affect appropriate  Fetal Status: Fetal Heart Rate (bpm): 150 Fundal Height: 36 cm Movement: Present    Chaperone: Amanda Rash    No results found for this or any previous visit (from the past 24 hour(s)).   Assessment & Plan:  1) Low-risk pregnancy G2P0010 at [redacted]w[redacted]d with an Estimated Date of Delivery: 04/27/23   No acute concerns   Meds: No orders of the defined types were placed in this encounter.  Labs/procedures today: none  Plan:  Continue routine obstetrical care  Next visit: prefers in person    Reviewed: Term labor symptoms and general obstetric precautions including but not limited to vaginal bleeding, contractions, leaking of fluid and fetal movement were reviewed in detail with the patient.  All questions were answered. Pt has home bp cuff. Check bp weekly, let us know if >140/90.   Follow-up: Return in about 1 week (around 04/14/2023) for LROB visit.  No orders of the defined types were placed in this encounter.   Myna Hidalgo, DO Attending Obstetrician & Gynecologist, [facility] for [employer], [facility]

## 2023-04-14 ENCOUNTER — Encounter: Payer: Self-pay | Admitting: Obstetrics & Gynecology

## 2023-04-14 ENCOUNTER — Ambulatory Visit (INDEPENDENT_AMBULATORY_CARE_PROVIDER_SITE_OTHER): Payer: 59 | Admitting: Obstetrics & Gynecology

## 2023-04-14 VITALS — BP 126/87 | HR 82 | Wt 221.0 lb

## 2023-04-14 DIAGNOSIS — Z3483 Encounter for supervision of other normal pregnancy, third trimester: Secondary | ICD-10-CM

## 2023-04-14 DIAGNOSIS — Z3A38 38 weeks gestation of pregnancy: Secondary | ICD-10-CM

## 2023-04-14 NOTE — Progress Notes (Signed)
   PRENATAL VISIT NOTE  Subjective:  Melissa Sheppard is a 32 y.o. G2P0010 at [redacted]w[redacted]d being seen today for ongoing prenatal care.  She is currently monitored for the following issues for this low-risk pregnancy and has Hidradenitis suppurativa; Supervision of normal pregnancy; and Alpha thalassemia silent carrier on their problem list.  Patient reports no complaints.  Contractions: Irritability. Vag. Bleeding: None.  Movement: Present. Denies leaking of fluid.   The following portions of the patient's history were reviewed and updated as appropriate: allergies, current medications, past family history, past medical history, past social history, past surgical history and problem list.   Objective:   Vitals:   04/14/23 0821  BP: 126/87  Pulse: 82  Weight: 221 lb (100.2 kg)    Fetal Status: Fetal Heart Rate (bpm): 143 Fundal Height: 38 cm Movement: Present     General:  Alert, oriented and cooperative. Patient is in no acute distress.  Skin: Skin is warm and dry. No rash noted.   Cardiovascular: Normal heart rate noted  Respiratory: Normal respiratory effort, no problems with respiration noted  Abdomen: Soft, gravid, appropriate for gestational age.  Pain/Pressure: Absent     Pelvic: Cervical exam deferred        Extremities: Normal range of motion.  Edema: Trace  Mental Status: Normal mood and affect. Normal behavior. Normal judgment and thought content.   Assessment and Plan:  Pregnancy: G2P0010 at [redacted]w[redacted]d 1. [redacted] weeks gestation of pregnancy 2. Encounter for supervision of other normal pregnancy in third trimester Discussed IOL at 41 weeks.  Scheduled on 05/04/23 morning, orders signed and held. Term labor symptoms and general obstetric precautions including but not limited to vaginal bleeding, contractions, leaking of fluid and fetal movement were reviewed in detail with the patient. Please refer to After Visit Summary for other counseling recommendations.   Return in about 1 week  (around 04/21/2023) for OFFICE OB VISIT (MD or APP).  Future Appointments  Date Time Provider Department Center  04/21/2023  8:10 AM Lennart Pall, MD [facility]  05/04/2023  6:45 AM [facility] SCHED ROOM [facility] None    Jaynie Collins, MD

## 2023-04-14 NOTE — Patient Instructions (Signed)
Induction of labor scheduled 05/04/23 morning  Return to office for any scheduled appointments. Call the office or go to the MAU at [facility] if: You begin to have strong, frequent contractions Your water breaks.  Sometimes it is a big gush of fluid, sometimes it is just a trickle that keeps getting your underwear wet or running down your legs You have vaginal bleeding.  It is normal to have a small amount of spotting if your cervix was checked.  You do not feel your baby moving like normal.  If you do not, get something to eat and drink and lay down and focus on feeling your baby move.   If your baby is still not moving like normal, you should call the office or go to MAU. Any other obstetric concerns.

## 2023-04-18 ENCOUNTER — Inpatient Hospital Stay (HOSPITAL_COMMUNITY)
Admission: AD | Admit: 2023-04-18 | Discharge: 2023-04-21 | DRG: 807 | Disposition: A | Discharge status: Home / Self Care | Payer: 59 | Attending: Family Medicine | Admitting: Family Medicine

## 2023-04-18 DIAGNOSIS — O1400 Mild to moderate pre-eclampsia, unspecified trimester: Secondary | ICD-10-CM | POA: Insufficient documentation

## 2023-04-18 DIAGNOSIS — O1404 Mild to moderate pre-eclampsia, complicating childbirth: Principal | ICD-10-CM | POA: Diagnosis present

## 2023-04-18 DIAGNOSIS — Z3A38 38 weeks gestation of pregnancy: Secondary | ICD-10-CM

## 2023-04-18 DIAGNOSIS — Z3490 Encounter for supervision of normal pregnancy, unspecified, unspecified trimester: Secondary | ICD-10-CM

## 2023-04-18 DIAGNOSIS — Z148 Genetic carrier of other disease: Secondary | ICD-10-CM

## 2023-04-18 DIAGNOSIS — Z3483 Encounter for supervision of other normal pregnancy, third trimester: Secondary | ICD-10-CM

## 2023-04-18 HISTORY — DX: Other specified health status: Z78.9

## 2023-04-19 ENCOUNTER — Inpatient Hospital Stay (HOSPITAL_COMMUNITY): Payer: 59 | Admitting: Anesthesiology

## 2023-04-19 ENCOUNTER — Encounter (HOSPITAL_COMMUNITY): Payer: Self-pay | Admitting: Obstetrics and Gynecology

## 2023-04-19 ENCOUNTER — Other Ambulatory Visit: Payer: Self-pay

## 2023-04-19 DIAGNOSIS — O26893 Other specified pregnancy related conditions, third trimester: Secondary | ICD-10-CM | POA: Diagnosis present

## 2023-04-19 DIAGNOSIS — Z3A38 38 weeks gestation of pregnancy: Secondary | ICD-10-CM | POA: Diagnosis not present

## 2023-04-19 DIAGNOSIS — O1404 Mild to moderate pre-eclampsia, complicating childbirth: Secondary | ICD-10-CM | POA: Diagnosis present

## 2023-04-19 DIAGNOSIS — O1400 Mild to moderate pre-eclampsia, unspecified trimester: Secondary | ICD-10-CM | POA: Insufficient documentation

## 2023-04-19 DIAGNOSIS — Z148 Genetic carrier of other disease: Secondary | ICD-10-CM | POA: Diagnosis not present

## 2023-04-19 LAB — TYPE AND SCREEN
ABO/RH(D): A POS
Antibody Screen: NEGATIVE

## 2023-04-19 LAB — COMPREHENSIVE METABOLIC PANEL
ALT: 11 U/L (ref 0–44)
AST: 16 U/L (ref 15–41)
Albumin: 2.6 g/dL — ABNORMAL LOW (ref 3.5–5.0)
Alkaline Phosphatase: 181 U/L — ABNORMAL HIGH (ref 38–126)
Anion gap: 11 (ref 5–15)
BUN: 9 mg/dL (ref 6–20)
CO2: 18 mmol/L — ABNORMAL LOW (ref 22–32)
Calcium: 9.4 mg/dL (ref 8.9–10.3)
Chloride: 105 mmol/L (ref 98–111)
Creatinine, Ser: 0.67 mg/dL (ref 0.44–1.00)
GFR, Estimated: >60 mL/min (ref >60)
Glucose, Bld: 85 mg/dL (ref 70–99)
Potassium: 4 mmol/L (ref 3.5–5.1)
Sodium: 134 mmol/L — ABNORMAL LOW (ref 135–145)
Total Bilirubin: 0.5 mg/dL (ref 0.3–1.2)
Total Protein: 6.3 g/dL — ABNORMAL LOW (ref 6.5–8.1)

## 2023-04-19 LAB — RPR: RPR Ser Ql: NONREACTIVE

## 2023-04-19 LAB — PROTEIN / CREATININE RATIO, URINE
Creatinine, Urine: 97 mg/dL
Protein Creatinine Ratio: 0.68 mg/mg{Cre} — ABNORMAL HIGH (ref 0.00–0.15)
Total Protein, Urine: 66 mg/dL

## 2023-04-19 LAB — CBC
HCT: 36.5 % (ref 36.0–46.0)
Hemoglobin: 11.4 g/dL — ABNORMAL LOW (ref 12.0–15.0)
MCH: 24.5 pg — ABNORMAL LOW (ref 26.0–34.0)
MCHC: 31.2 g/dL (ref 30.0–36.0)
MCV: 78.3 fL — ABNORMAL LOW (ref 80.0–100.0)
Platelets: 200 10*3/uL (ref 150–400)
RBC: 4.66 MIL/uL (ref 3.87–5.11)
RDW: 14.1 % (ref 11.5–15.5)
WBC: 8.9 10*3/uL (ref 4.0–10.5)
nRBC: 0 % (ref 0.0–0.2)

## 2023-04-19 LAB — POCT FERN TEST: POCT Fern Test: POSITIVE

## 2023-04-19 MED ORDER — FLEET ENEMA 7-19 GM/118ML RE ENEM: 1.0000 | ENEMA | Freq: Every day | RECTAL | Status: DC | PRN

## 2023-04-19 MED ORDER — SODIUM CHLORIDE 0.9 % IV SOLN: 250.0000 mL | INTRAVENOUS | Status: DC | PRN

## 2023-04-19 MED ORDER — ZOLPIDEM TARTRATE 5 MG PO TABS: 5.0000 mg | ORAL_TABLET | Freq: Every evening | ORAL | Status: DC | PRN

## 2023-04-19 MED ORDER — ACETAMINOPHEN 325 MG PO TABS: 650.0000 mg | ORAL_TABLET | ORAL | Status: DC | PRN

## 2023-04-19 MED ORDER — DIPHENHYDRAMINE HCL 50 MG/ML IJ SOLN: 12.5000 mg | INTRAMUSCULAR | Status: DC | PRN

## 2023-04-19 MED ORDER — PHENYLEPHRINE 80 MCG/ML (10ML) SYRINGE FOR IV PUSH (FOR BLOOD PRESSURE SUPPORT)
80.0000 ug | PREFILLED_SYRINGE | INTRAVENOUS | Status: DC | PRN
  Filled 2023-04-19: qty 10

## 2023-04-19 MED ORDER — SIMETHICONE 80 MG PO CHEW: 80.0000 mg | CHEWABLE_TABLET | ORAL | Status: DC | PRN

## 2023-04-19 MED ORDER — OXYTOCIN-SODIUM CHLORIDE 30-0.9 UT/500ML-% IV SOLN
1.0000 m[IU]/min | INTRAVENOUS | Status: DC
  Administered 2023-04-19: 2 m[IU]/min via INTRAVENOUS

## 2023-04-19 MED ORDER — FENTANYL CITRATE (PF) 100 MCG/2ML IJ SOLN
50.0000 ug | INTRAMUSCULAR | Status: DC | PRN
  Administered 2023-04-19 (×2): 100 ug via INTRAVENOUS
  Filled 2023-04-19 (×2): qty 2

## 2023-04-19 MED ORDER — EPHEDRINE 5 MG/ML INJ: 10.0000 mg | INTRAVENOUS | Status: DC | PRN

## 2023-04-19 MED ORDER — LABETALOL HCL 5 MG/ML IV SOLN: 20.0000 mg | INTRAVENOUS | Status: DC | PRN

## 2023-04-19 MED ORDER — IBUPROFEN 600 MG PO TABS
600.0000 mg | ORAL_TABLET | Freq: Four times a day (QID) | ORAL | Status: DC
  Administered 2023-04-20 – 2023-04-21 (×7): 600 mg via ORAL
  Filled 2023-04-19 (×7): qty 1

## 2023-04-19 MED ORDER — ONDANSETRON HCL 4 MG/2ML IJ SOLN: 4.0000 mg | INTRAMUSCULAR | Status: DC | PRN

## 2023-04-19 MED ORDER — FENTANYL-BUPIVACAINE-NACL 0.5-0.125-0.9 MG/250ML-% EP SOLN
12.0000 mL/h | EPIDURAL | Status: DC | PRN
  Administered 2023-04-19: 12 mL/h via EPIDURAL
  Filled 2023-04-19: qty 250

## 2023-04-19 MED ORDER — PRENATAL MULTIVITAMIN CH
1.0000 | ORAL_TABLET | Freq: Every day | ORAL | Status: DC
  Administered 2023-04-20 – 2023-04-21 (×2): 1 via ORAL
  Filled 2023-04-19 (×2): qty 1

## 2023-04-19 MED ORDER — LACTATED RINGERS IV SOLN: INTRAVENOUS | Status: DC

## 2023-04-19 MED ORDER — LABETALOL HCL 5 MG/ML IV SOLN: 40.0000 mg | INTRAVENOUS | Status: DC | PRN

## 2023-04-19 MED ORDER — OXYCODONE-ACETAMINOPHEN 5-325 MG PO TABS: 2.0000 | ORAL_TABLET | ORAL | Status: DC | PRN

## 2023-04-19 MED ORDER — OXYTOCIN-SODIUM CHLORIDE 30-0.9 UT/500ML-% IV SOLN
2.5000 [IU]/h | INTRAVENOUS | Status: DC
  Administered 2023-04-19: 2.5 [IU]/h via INTRAVENOUS
  Filled 2023-04-19: qty 500

## 2023-04-19 MED ORDER — LACTATED RINGERS IV SOLN
500.0000 mL | Freq: Once | INTRAVENOUS | Status: AC
  Administered 2023-04-19: 500 mL via INTRAVENOUS

## 2023-04-19 MED ORDER — SOD CITRATE-CITRIC ACID 500-334 MG/5ML PO SOLN: 30.0000 mL | ORAL | Status: DC | PRN

## 2023-04-19 MED ORDER — ONDANSETRON HCL 4 MG/2ML IJ SOLN: 4.0000 mg | Freq: Four times a day (QID) | INTRAMUSCULAR | Status: DC | PRN

## 2023-04-19 MED ORDER — LIDOCAINE-EPINEPHRINE (PF) 1.5 %-1:200000 IJ SOLN
INTRAMUSCULAR | Status: DC | PRN
  Administered 2023-04-19: 5 mL via EPIDURAL

## 2023-04-19 MED ORDER — DIBUCAINE (PERIANAL) 1 % EX OINT: 1.0000 | TOPICAL_OINTMENT | CUTANEOUS | Status: DC | PRN

## 2023-04-19 MED ORDER — DIPHENHYDRAMINE HCL 25 MG PO CAPS: 25.0000 mg | ORAL_CAPSULE | Freq: Four times a day (QID) | ORAL | Status: DC | PRN

## 2023-04-19 MED ORDER — BENZOCAINE-MENTHOL 20-0.5 % EX AERO
1.0000 | INHALATION_SPRAY | CUTANEOUS | Status: DC | PRN
  Administered 2023-04-20: 1 via TOPICAL
  Filled 2023-04-19: qty 56

## 2023-04-19 MED ORDER — SODIUM CHLORIDE 0.9% FLUSH: 3.0000 mL | Freq: Two times a day (BID) | INTRAVENOUS | Status: DC

## 2023-04-19 MED ORDER — LIDOCAINE HCL (PF) 1 % IJ SOLN
30.0000 mL | INTRAMUSCULAR | Status: AC | PRN
  Administered 2023-04-19: 30 mL via SUBCUTANEOUS
  Filled 2023-04-19: qty 30

## 2023-04-19 MED ORDER — WITCH HAZEL-GLYCERIN EX PADS: 1.0000 | MEDICATED_PAD | CUTANEOUS | Status: DC | PRN

## 2023-04-19 MED ORDER — LIDOCAINE HCL (PF) 1 % IJ SOLN
INTRAMUSCULAR | Status: DC | PRN
  Administered 2023-04-19: 5 mL via EPIDURAL

## 2023-04-19 MED ORDER — HYDROXYZINE HCL 50 MG PO TABS: 50.0000 mg | ORAL_TABLET | Freq: Four times a day (QID) | ORAL | Status: DC | PRN

## 2023-04-19 MED ORDER — SENNOSIDES-DOCUSATE SODIUM 8.6-50 MG PO TABS
2.0000 | ORAL_TABLET | ORAL | Status: DC
  Administered 2023-04-20 – 2023-04-21 (×2): 2 via ORAL
  Filled 2023-04-19 (×2): qty 2

## 2023-04-19 MED ORDER — HYDRALAZINE HCL 20 MG/ML IJ SOLN: 10.0000 mg | INTRAMUSCULAR | Status: DC | PRN

## 2023-04-19 MED ORDER — LACTATED RINGERS IV SOLN: 500.0000 mL | INTRAVENOUS | Status: DC | PRN

## 2023-04-19 MED ORDER — OXYTOCIN BOLUS FROM INFUSION
333.0000 mL | Freq: Once | INTRAVENOUS | Status: AC
  Administered 2023-04-19: 333 mL via INTRAVENOUS

## 2023-04-19 MED ORDER — SODIUM CHLORIDE 0.9% FLUSH: 3.0000 mL | INTRAVENOUS | Status: DC | PRN

## 2023-04-19 MED ORDER — ONDANSETRON HCL 4 MG PO TABS: 4.0000 mg | ORAL_TABLET | ORAL | Status: DC | PRN

## 2023-04-19 MED ORDER — LABETALOL HCL 5 MG/ML IV SOLN: 80.0000 mg | INTRAVENOUS | Status: DC | PRN

## 2023-04-19 MED ORDER — TERBUTALINE SULFATE 1 MG/ML IJ SOLN: 0.2500 mg | Freq: Once | INTRAMUSCULAR | Status: DC | PRN

## 2023-04-19 MED ORDER — COCONUT OIL OIL
1.0000 | TOPICAL_OIL | Status: DC | PRN
  Administered 2023-04-20: 1 via TOPICAL

## 2023-04-19 MED ORDER — PHENYLEPHRINE 80 MCG/ML (10ML) SYRINGE FOR IV PUSH (FOR BLOOD PRESSURE SUPPORT): 80.0000 ug | PREFILLED_SYRINGE | INTRAVENOUS | Status: DC | PRN

## 2023-04-19 MED ORDER — OXYCODONE-ACETAMINOPHEN 5-325 MG PO TABS: 1.0000 | ORAL_TABLET | ORAL | Status: DC | PRN

## 2023-04-19 NOTE — Anesthesia Procedure Notes (Signed)
Epidural Patient location during procedure: OB Start time: 04/19/2023 7:55 AM End time: 04/19/2023 8:05 AM  Staffing Anesthesiologist: Leonides Grills, MD Performed: anesthesiologist   Preanesthetic Checklist Completed: patient identified, IV checked, site marked, risks and benefits discussed, monitors and equipment checked, pre-op evaluation and timeout performed  Epidural Patient position: sitting Prep: DuraPrep Patient monitoring: heart rate, cardiac monitor, continuous pulse ox and blood pressure Approach: midline Location: L4-L5 Injection technique: LOR air  Needle:  Needle type: Tuohy  Needle gauge: 17 G Needle length: 9 cm Needle insertion depth: 8 cm Catheter type: closed end flexible Catheter size: 19 Gauge Catheter at skin depth: 13 cm Test dose: negative and 1.5% lidocaine with Epi 1:200 K  Assessment Events: blood not aspirated, no cerebrospinal fluid, injection not painful, no injection resistance and negative IV test  Additional Notes Informed consent obtained prior to proceeding including risk of failure, 1% risk of PDPH, risk of minor discomfort and bruising. Discussed alternatives to epidural analgesia and patient desires to proceed.  Timeout performed pre-procedure verifying patient name, procedure, and platelet count.  Patient tolerated procedure well. Reason for block:procedure for pain

## 2023-04-19 NOTE — Discharge Instructions (Signed)

## 2023-04-19 NOTE — MAU Note (Signed)
.  Melissa Sheppard is a 32 y.o. at [redacted]w[redacted]d here in MAU reporting:   Contractions every: 5-10 minutes since 2320 Onset of ctx: Yesterday Pain score: 5/10  ROM: Possible ROM 2300 clear fluid, grossly LOF Vaginal Bleeding: None Last SVE: UKN  Epidural: Unable to assess  Fetal Movement: Reports positive FM FHT:125 via External  Vitals:   04/19/23 0044  BP: (!) 134/92  Pulse: 73  Resp: 16  Temp: 98.5 F (36.9 C)  SpO2: 98%       OB Office: Faculty GBS: Negative HSV: Denies hx of HSV Lab orders placed from triage: MAU Labor Eval

## 2023-04-19 NOTE — Discharge Summary (Signed)
Postpartum Discharge Summary    Patient Name: Melissa Sheppard DOB: Aug 05, 1991 MRN: [id number]  Date of admission: 04/18/2023 Delivery date:04/19/2023  Delivering provider: Calvert Cantor  Date of discharge: 04/21/2023  Admitting diagnosis: Indication for care in labor or delivery [O75.9] Intrauterine pregnancy: [redacted]w[redacted]d     Secondary diagnosis:  Principal Problem:   Vaginal delivery Active Problems:   Supervision of normal pregnancy   Mild preeclampsia  Additional problems: n/a    Discharge diagnosis: Term Pregnancy Delivered and Preeclampsia (mild)                                              Post partum procedures: N/A Augmentation: Pitocin Complications: None  Hospital course: Onset of Labor With Vaginal Delivery      32 y.o. yo G2P0010 at [redacted]w[redacted]d was admitted in Active Labor on 04/18/2023. Labor course was complicated by new diagnosis of mild preeclampsia  Membrane Rupture Time/Date: 11:00 PM ,04/18/2023   Delivery Method:Vaginal, Spontaneous  Episiotomy: None  Lacerations:  2nd degree;Perineal;1st degree;Labial  Patient had a postpartum course complicated by HTN, started on lasix and procardia.  She is ambulating, tolerating a regular diet, passing flatus, and urinating well. Patient is discharged home in stable condition on 04/21/23.  Newborn Data: Birth date:04/19/2023  Birth time:5:37 PM  Gender:Female  Living status:Living  Apgars:8 ,9  Weight:3800 g   Magnesium Sulfate received: No BMZ received: No Rhophylac:N/A MMR:N/A T-DaP:Given prenatally Flu: Given prenatally Transfusion:No  Physical exam  Vitals:   04/20/23 1321 04/20/23 1432 04/20/23 2030 04/21/23 0530  BP: 136/87 (!) 148/93 126/88 118/84  Pulse: 75 85 80 79  Resp:  16 17 17   Temp:  98.6 F (37 C) 98.1 F (36.7 C) 97.7 F (36.5 C)  TempSrc:  Oral Oral Oral  SpO2: 100%  99% 100%  Weight:      Height:       General: alert, cooperative, and no distress Lochia: appropriate Uterine Fundus:  firm Incision: N/A DVT Evaluation: No evidence of DVT seen on physical exam. Labs: Lab Results  Component Value Date   WBC 13.6 (H) 04/20/2023   HGB 9.4 (L) 04/20/2023   HCT 28.4 (L) 04/20/2023   MCV 75.9 (L) 04/20/2023   PLT 178 04/20/2023      Latest Ref Rng & Units 04/19/2023    1:20 AM  CMP  Glucose 70 - 99 mg/dL 85   BUN 6 - 20 mg/dL 9   Creatinine 4.09 - 8.11 mg/dL 9.14   Sodium 782 - 956 mmol/L 134   Potassium 3.5 - 5.1 mmol/L 4.0   Chloride 98 - 111 mmol/L 105   CO2 22 - 32 mmol/L 18   Calcium 8.9 - 10.3 mg/dL 9.4   Total Protein 6.5 - 8.1 g/dL 6.3   Total Bilirubin 0.3 - 1.2 mg/dL 0.5   Alkaline Phos 38 - 126 U/L 181   AST 15 - 41 U/L 16   ALT 0 - 44 U/L 11    Edinburgh Score:    04/20/2023    9:55 AM  Edinburgh Postnatal Depression Scale Screening Tool  I have been able to laugh and see the funny side of things. 0  I have looked forward with enjoyment to things. 0  I have blamed myself unnecessarily when things went wrong. 0  I have been anxious or worried for no good reason. 1  I have felt scared or panicky for no good reason. 0  Things have been getting on top of me. 1  I have been so unhappy that I have had difficulty sleeping. 0  I have felt sad or miserable. 0  I have been so unhappy that I have been crying. 0  The thought of harming myself has occurred to me. 0  Edinburgh Postnatal Depression Scale Total 2     After visit meds:  Allergies as of 04/21/2023       Reactions   Amoxicillin Rash        Medication List     STOP taking these medications    PRENATAL VITAMIN PO       TAKE these medications    acetaminophen 325 MG tablet Commonly known as: Tylenol Take 2 tablets (650 mg total) by mouth every 4 (four) hours as needed (for pain scale < 4).   benzocaine-Menthol 20-0.5 % Aero Commonly known as: DERMOPLAST Apply 1 Application topically as needed for irritation (perineal discomfort).   furosemide 20 MG tablet Commonly known  as: LASIX Take 1 tablet (20 mg total) by mouth daily for 3 days. Start taking on: April 22, 2023   ibuprofen 600 MG tablet Commonly known as: ADVIL Take 1 tablet (600 mg total) by mouth every 6 (six) hours.   NIFEdipine 30 MG 24 hr tablet Commonly known as: ADALAT CC Take 1 tablet (30 mg total) by mouth daily. Start taking on: April 22, 2023   senna-docusate 8.6-50 MG tablet Commonly known as: Senokot-S Take 2 tablets by mouth daily. Start taking on: April 22, 2023   witch hazel-glycerin pad Commonly known as: TUCKS Apply 1 Application topically as needed for hemorrhoids.         Discharge home in stable condition Infant Feeding: Bottle and Breast Infant Disposition:home with mother Discharge instruction: per After Visit Summary and Postpartum booklet. Activity: Advance as tolerated. Pelvic rest for 6 weeks.  Diet: routine diet Future Appointments: Future Appointments  Date Time Provider Department Center  05/31/2023  1:10 PM Rasch, Harolyn Rutherford, NP [facility]   Follow up Visit: Message sent by Thalia Bloodgood on 04/19/2023  Please schedule this patient for a In person postpartum visit in 4 weeks with the following provider: Any provider. Additional Postpartum F/U:BP check 1 week  High risk pregnancy complicated by:  Mild Preeclampsia (IP) Delivery mode:  Vaginal, Spontaneous  Anticipated Birth Control:  Nexplanon   04/21/2023 Myrtie Hawk, DO

## 2023-04-19 NOTE — H&P (Addendum)
OBSTETRIC ADMISSION HISTORY AND PHYSICAL  Melissa Sheppard is a 32 y.o. female G2P0010 with IUP at [redacted]w[redacted]d by LMP presenting for CTX and possible ROM. She reports +FMs, No LOF, no VB, no blurry vision, headaches or peripheral edema, and RUQ pain.  She plans on breast/bottle feeding. She requests Nexplanon for birth control. She received her prenatal care at  [facility]    Dating: By LMP --->  Estimated Date of Delivery: 04/27/23  Sono:    , CWD, normal anatomy, cephalic presentation, posterior placental lie, 1318g, 76% EFW  Prenatal History/Complications:  -Silent carrier of alpha thal         Nursing Staff Provider  Office Location [place] Dating  02/05/2023, by Last Menstrual Period  [facility] Model [ x] Traditional  Centering  Mom-Baby Dyad Anatomy US  Normal   Language  English      Flu Vaccine  Done 11/12/22 Genetic/Carrier Screen  NIPS:   LR XX AFP:   wnl Horizon: alpha thal [address] - declined FOB testing  TDaP Vaccine   Done 02/04/23 Hgb A1C or  GTT Early  Third trimester normal  COVID Vaccine     LAB RESULTS   Rhogam  A/RH(D) POSITIVE/-- (09/19 1142)  Blood Type A/RH(D) POSITIVE/-- (09/19 1142)   Baby Feeding Plan Breast  but poss formula Antibody NO ANTIBODIES DETECTED (09/19 1142)  Contraception Nexplanon Rubella 14.90 (09/19 1142)  Circumcision N/a RPR Non Reactive (02/09 0837)   Pediatrician    HBsAg NON-REACTIVE (09/19 1142)   Support Person John HCVAb   Negative  Prenatal Classes   HIV Non Reactive (02/09 0837)     BTL Consent   GBS  neg (For PCN allergy, check sensitivities)   VBAC Consent   Pap 5/23 NML           DME Rx [x ] BP cuff  Weight Scale Waterbirth   Class  Consent  CNM visit  PHQ9 & GAD7 [  x] new OB [  x] 28 weeks  [  ] 36 weeks Induction   Orders Entered Foley Y/N     Past Medical History: Past Medical History:  Diagnosis Date   Medical history non-contributory    No pertinent past medical history     Past Surgical  History: Past Surgical History:  Procedure Laterality Date   NO PAST SURGERIES      Obstetrical History: OB History     Gravida  2   Para  0   Term      Preterm      AB  1   Living         SAB  1   IAB      Ectopic      Multiple      Live Births              Social History Social History   Socioeconomic History   Marital status: Married    Spouse name: Not on file   Number of children: Not on file   Years of education: Not on file   Highest education level: Not on file  Occupational History   Not on file  Tobacco Use   Smoking status: Never   Smokeless tobacco: Never  Vaping Use   Vaping Use: Never used  Substance and Sexual Activity   Alcohol use: Not Currently    Comment: occasionally   Drug use: Never   Sexual activity: Yes  Birth control/protection: None  Other Topics Concern   Not on file  Social History Narrative   Not on file   Social Determinants of Health   Financial Resource Strain: Not on file  Food Insecurity: No Food Insecurity (04/19/2023)   Hunger Vital Sign    Worried About Running Out of Food in the Last Year: Never true    Ran Out of Food in the Last Year: Never true  Transportation Needs: No Transportation Needs (04/19/2023)   PRAPARE - Administrator, Civil Service (Medical): No    Lack of Transportation (Non-Medical): No  Physical Activity: Not on file  Stress: Not on file  Social Connections: Not on file    Family History: Family History  Problem Relation Age of Onset   Stroke Mother    Diabetes Father    Stroke Maternal Uncle    Stroke Maternal Grandmother    Breast cancer Cousin    Hypertension Other    Asthma Neg Hx    Cancer Neg Hx     Allergies: Allergies  Allergen Reactions   Amoxicillin Rash    Medications Prior to Admission  Medication Sig Dispense Refill Last Dose   Prenatal Vit-Fe Fumarate-FA (PRENATAL VITAMIN PO) Take by mouth.   04/19/2023     Review of Systems   All  systems reviewed and negative except as stated in HPI  Blood pressure (!) 128/100, pulse 76, temperature 97.9 F (36.6 C), temperature source Oral, resp. rate 16, height 5\' 8"  (1.727 m), weight 101.6 kg, last menstrual period 07/21/2022, SpO2 99 %. General appearance: alert, cooperative, appears stated age, and no distress Lungs: clear to auscultation bilaterally Heart: regular rate and rhythm Abdomen: soft, non-tender; bowel sounds normal Pelvic: Deferred as was recently checked  Extremities: Homans sign is negative, no sign of DVT Presentation: cephalic Fetal monitoringBaseline: 120 bpm, Variability: Good {> 6 bpm), Accelerations: Reactive, and Decelerations: Absent Uterine activityFrequency: Every 2-4 minutes Dilation: 7 Effacement (%): 100 Station: Plus 1 Exam by:: Earlene Plater, RN   Prenatal labs: ABO, Rh: --/--/A POS (04/23 0155) Antibody: NEG (04/23 0155) Rubella: 14.90 (09/19 1142) RPR: NON REACTIVE (04/23 0120)  HBsAg: NON-REACTIVE (09/19 1142)  HIV: Non Reactive (02/09 0837)  GBS: Negative/-- (04/04 0000) Negative 1 hr Glucola normal Genetic screening  NIPS LR female, AFP WNL, Horizon alpha thal [place] Anatomy US normal  Prenatal Transfer Tool  Maternal Diabetes: No Genetic Screening: Normal Maternal Ultrasounds/Referrals: Normal Fetal Ultrasounds or other Referrals:  None Maternal Substance Abuse:  No Significant Maternal Medications:  None Significant Maternal Lab Results:  Group B Strep negative Number of Prenatal Visits:greater than 3 verified prenatal visits Other Comments:  None  Results for orders placed or performed during the hospital encounter of 04/18/23 (from the past 24 hour(s))  POCT fern test   Collection Time: 04/19/23  1:06 AM  Result Value Ref Range   POCT Fern Test Positive = ruptured amniotic membanes   Comprehensive metabolic panel   Collection Time: 04/19/23  1:20 AM  Result Value Ref Range   Sodium 134 (L) 135 - 145 mmol/L   Potassium 4.0 3.5 -  5.1 mmol/L   Chloride 105 98 - 111 mmol/L   CO2 18 (L) 22 - 32 mmol/L   Glucose, Bld 85 70 - 99 mg/dL   BUN 9 6 - 20 mg/dL   Creatinine, Ser 1.61 0.44 - 1.00 mg/dL   Calcium 9.4 8.9 - 09.6 mg/dL   Total Protein 6.3 (L) 6.5 - 8.1 g/dL  Albumin 2.6 (L) 3.5 - 5.0 g/dL   AST 16 15 - 41 U/L   ALT 11 0 - 44 U/L   Alkaline Phosphatase 181 (H) 38 - 126 U/L   Total Bilirubin 0.5 0.3 - 1.2 mg/dL   GFR, Estimated >16 >10 mL/min   Anion gap 11 5 - 15  CBC   Collection Time: 04/19/23  1:20 AM  Result Value Ref Range   WBC 8.9 4.0 - 10.5 K/uL   RBC 4.66 3.87 - 5.11 MIL/uL   Hemoglobin 11.4 (L) 12.0 - 15.0 g/dL   HCT 96.0 45.4 - 09.8 %   MCV 78.3 (L) 80.0 - 100.0 fL   MCH 24.5 (L) 26.0 - 34.0 pg   MCHC 31.2 30.0 - 36.0 g/dL   RDW 11.9 14.7 - 82.9 %   Platelets 200 150 - 400 K/uL   nRBC 0.0 0.0 - 0.2 %  RPR   Collection Time: 04/19/23  1:20 AM  Result Value Ref Range   RPR Ser Ql NON REACTIVE NON REACTIVE  Type and screen   Collection Time: 04/19/23  1:55 AM  Result Value Ref Range   ABO/RH(D) A POS    Antibody Screen NEG    Sample Expiration      04/22/2023,2359 Performed at [facility], [address], [place], [place] [address]     Patient Active Problem List   Diagnosis Date Noted   Indication for care in labor or delivery 04/19/2023   Alpha thalassemia silent carrier 12/09/2022   Supervision of normal pregnancy 09/14/2022   Hidradenitis suppurativa 05/11/2013    Assessment/Plan:  Melissa Sheppard is a 32 y.o. G2P0010 at [redacted]w[redacted]d here for SOL.  #Labor: ROM with positive fern.  #Pain: Epidural in place #FWB: Cat 1 #ID: GBS negative #MOF: Breast and bottle #MOC: Nexplanon desired #Circ:  N/a  #gHTN Elevated BP on admission.  -Urine PCR pending. Other pre-E labs negative -PRNs available -Continue to monitor   #Alpha thal silent carrier -Continue to monitor  NIKE  04/19/2023, 8:59 AM  Attestation of Supervision of Student:  I confirm that I  have verified the information documented in the resident's note and that I have also personally reperformed the physical exam and supervised the history, physical exam and all medical decision making activities.  I have verified that all services and findings are accurately documented in this student's note; and I agree with management and plan as outlined in the documentation. I have also made any necessary editorial changes.  Calvert Cantor, CNM Center for [employer], [facility] 04/19/2023 12:39 PM

## 2023-04-19 NOTE — Progress Notes (Signed)
Labor Progress Note  Melissa Sheppard is a 32 y.o. G2P0010 at [redacted]w[redacted]d presented for SOL  S: No acute events since last check. Patient resting comfortably in thrown position. Discussed recent check with RN and patient is making change and therefore discussed not needed pitocin at this time.  O:  BP 127/66   Pulse 83   Temp 97.9 F (36.6 C) (Oral)   Resp 16   Ht  (1.727 m)   Wt 101.6 kg   LMP 07/21/2022   SpO2 99%   BMI 34.06 kg/m  EFM: 125 bpm/Moderate variability/ 15x15 accels/ None decels  CVE: Dilation: 9 Effacement (%): 90 Cervical Position: Posterior Station: 0, Plus 1 Presentation: Vertex Exam by:: Earlene Plater, RN   A&P: 32 y.o. G2P0010 [redacted]w[redacted]d  here for SOL as above  #Labor: Progressing well and making change consistently.  #Pain: Family/Friend support and Epidural #FWB: CAT 1 #GBS negative #MOF: Breast and bottle #MOC: Nexplanon desired #Circ:   N/a  #gHTN Elevated BP on admission.  -Urine PCR pending. Other pre-E labs negative -PRNs available -Continue to monitor    #Alpha thal silent carrier -Continue to monitor  NIKE PGY-3 04/19/23  12:32 PM

## 2023-04-19 NOTE — Anesthesia Preprocedure Evaluation (Signed)
Anesthesia Evaluation  Patient identified by MRN, date of birth, ID band Patient awake    Reviewed: Allergy & Precautions, H&P , NPO status , Patient's Chart, lab work & pertinent test results  History of Anesthesia Complications Negative for: history of anesthetic complications  Airway Mallampati: II  TM Distance: >3 FB Neck ROM: full    Dental no notable dental hx. (+) Teeth Intact   Pulmonary neg pulmonary ROS   Pulmonary exam normal breath sounds clear to auscultation       Cardiovascular negative cardio ROS Normal cardiovascular exam Rhythm:regular Rate:Normal     Neuro/Psych negative neurological ROS  negative psych ROS   GI/Hepatic negative GI ROS, Neg liver ROS,,,  Endo/Other  negative endocrine ROS    Renal/GU negative Renal ROS  negative genitourinary   Musculoskeletal   Abdominal  (+) + obese  Peds  Hematology negative hematology ROS (+)   Anesthesia Other Findings   Reproductive/Obstetrics (+) Pregnancy                             Anesthesia Physical Anesthesia Plan  ASA: 2  Anesthesia Plan: Epidural   Post-op Pain Management:    Induction:   PONV Risk Score and Plan:   Airway Management Planned:   Additional Equipment:   Intra-op Plan:   Post-operative Plan:   Informed Consent: I have reviewed the patients History and Physical, chart, labs and discussed the procedure including the risks, benefits and alternatives for the proposed anesthesia with the patient or authorized representative who has indicated his/her understanding and acceptance.       Plan Discussed with:   Anesthesia Plan Comments:        Anesthesia Quick Evaluation

## 2023-04-19 NOTE — MAU Note (Signed)
Pt informed that the ultrasound is considered a limited OB ultrasound and is not intended to be a complete ultrasound exam.  Patient also informed that the ultrasound is not being completed with the intent of assessing for fetal or placental anomalies or any pelvic abnormalities.  Explained that the purpose of today's ultrasound is to assess for presentation.  Patient acknowledges the purpose of the exam and the limitations of the study.    Vertex presentation confirmed 

## 2023-04-20 LAB — CBC
HCT: 28.4 % — ABNORMAL LOW (ref 36.0–46.0)
Hemoglobin: 9.4 g/dL — ABNORMAL LOW (ref 12.0–15.0)
MCH: 25.1 pg — ABNORMAL LOW (ref 26.0–34.0)
MCHC: 33.1 g/dL (ref 30.0–36.0)
MCV: 75.9 fL — ABNORMAL LOW (ref 80.0–100.0)
Platelets: 178 10*3/uL (ref 150–400)
RBC: 3.74 MIL/uL — ABNORMAL LOW (ref 3.87–5.11)
RDW: 14.5 % (ref 11.5–15.5)
WBC: 13.6 10*3/uL — ABNORMAL HIGH (ref 4.0–10.5)
nRBC: 0 % (ref 0.0–0.2)

## 2023-04-20 MED ORDER — FUROSEMIDE 20 MG PO TABS
20.0000 mg | ORAL_TABLET | Freq: Every day | ORAL | Status: DC
  Administered 2023-04-20 – 2023-04-21 (×2): 20 mg via ORAL
  Filled 2023-04-20 (×3): qty 1

## 2023-04-20 MED ORDER — NIFEDIPINE ER OSMOTIC RELEASE 30 MG PO TB24
30.0000 mg | ORAL_TABLET | Freq: Every day | ORAL | Status: DC
  Administered 2023-04-20 – 2023-04-21 (×2): 30 mg via ORAL
  Filled 2023-04-20 (×2): qty 1

## 2023-04-20 NOTE — Progress Notes (Addendum)
POSTPARTUM PROGRESS NOTE  Post Partum Day 1  Subjective:  Melissa Sheppard is a 32 y.o. G2P1011 s/p SVD at [redacted]w[redacted]d.  She reports she is doing well. No acute events overnight. She denies any problems with ambulating, voiding or po intake. Denies nausea or vomiting.  Pain is well controlled.  Lochia is appropriate.  Objective: Blood pressure (!) 148/93, pulse 85, temperature 98.6 F (37 C), temperature source Oral, resp. rate 16, height  (1.727 m), weight 101.6 kg, last menstrual period 07/21/2022, SpO2 100 %, unknown if currently breastfeeding.  Physical Exam:  General: alert, cooperative and no distress Chest: no respiratory distress Heart:regular rate, distal pulses intact Uterine Fundus: firm, appropriately tender DVT Evaluation: No calf swelling or tenderness Extremities: mild pitting edema similar to yesterday  Skin: warm, dry  Recent Labs    04/19/23 0120 04/20/23 0544  HGB 11.4* 9.4*  HCT 36.5 28.4*    Assessment/Plan: Melissa Sheppard is a 32 y.o. G2P1011 s/p SVD at [redacted]w[redacted]d   PPD#1 - Doing well  Routine postpartum care  #Mild Pre-E (Elevated BP with elevated PCR) 3 SBP and 4 DBP overnight that are over goal.  -Add procardia 30 mg qd  -Add Lasix 20 mg qd X 5 days   Contraception: Nexplanon outpatient Feeding: Both. Having difficulty with latching and therefore asking assistance from lactation Dispo: Plan for discharge tomorrow.   LOS: 1 day   Gilles Chiquito PGY-3 04/20/2023, 3:12 PM    Attestation of CNM Supervision of Resident: Evaluation and management procedures were performed by the [facility] Medicine Resident under my supervision. I was immediately available for direct supervision, assistance and direction throughout this encounter.  I also confirm that I have verified the information documented in the resident's note, and that I have also personally reperformed the pertinent components of the physical exam and all of the medical decision making  activities.  I have also made any necessary editorial changes.  Brand Males, CNM 04/20/2023 5:14 PM

## 2023-04-20 NOTE — Anesthesia Postprocedure Evaluation (Signed)
Anesthesia Post Note  Patient: Melissa Sheppard  Procedure(s) Performed: AN AD HOC LABOR EPIDURAL     Patient location during evaluation: Mother Baby Anesthesia Type: Epidural Level of consciousness: awake, oriented and awake and alert Pain management: pain level controlled Vital Signs Assessment: post-procedure vital signs reviewed and stable Respiratory status: spontaneous breathing, respiratory function stable and nonlabored ventilation Cardiovascular status: stable Postop Assessment: adequate PO intake, able to ambulate, patient able to bend at knees, no apparent nausea or vomiting and no headache Anesthetic complications: no   No notable events documented.  Last Vitals:  Vitals:   04/20/23 0039 04/20/23 0540  BP: 132/88 (!) 149/92  Pulse: 76 73  Resp: 18 18  Temp: 36.5 C 36.7 C  SpO2:      Last Pain:  Vitals:   04/20/23 0540  TempSrc: Oral  PainSc: 5    Pain Goal:                   Kehinde Bowdish

## 2023-04-20 NOTE — Lactation Note (Signed)
This note was copied from a baby's chart. Lactation Consultation Note  Patient Name: Melissa Sheppard [id number] Date: 04/20/2023 Age:34 hours Reason for consult: Initial assessment  P1, Mother states she would like to breastfeed and formula feed.  Offered to assist mother with latching and mother declined and stated baby recently had bottle of formula. Encouraged breastfeeding before offering formula to help establish mother's milk supply.  Discussed cluster feeding and calling for help if needed.    Maternal Data Has patient been taught Hand Expression?: Yes Does the patient have breastfeeding experience prior to this delivery?: No  Feeding Mother's Current Feeding Choice: Breast Milk and Formula  Interventions Interventions: Breast feeding basics reviewed;Education;LC Services brochure   Consult Status Consult Status: PRN    Hardie Pulley 04/20/2023, 12:22 PM

## 2023-04-20 NOTE — Lactation Note (Signed)
This note was copied from a baby's chart. Lactation Consultation Note  Patient Name: Melissa Sheppard [id number] Date: 04/20/2023 Age:34 hours Reason for consult: Follow-up assessment  P1, Mother called for assistance with latching.  Baby is breastfeeding and formula feeding.  Mother states earlier she was able to latch baby in football hold.  Baby in cradle hold on mother's breastfeeding pillow when LC entered room.  Assisted latching using teacup hold.  Encouraged mother to compress breast and support base of neck to bring baby deep on breast.  Discussed cluster feeding.    Maternal Data Has patient been taught Hand Expression?: Yes Does the patient have breastfeeding experience prior to this delivery?: No  Feeding Mother's Current Feeding Choice: Breast Milk and Formula  LATCH Score Latch: Repeated attempts needed to sustain latch, nipple held in mouth throughout feeding, stimulation needed to elicit sucking reflex.  Audible Swallowing: A few with stimulation  Type of Nipple: Everted at rest and after stimulation  Comfort (Breast/Nipple): Soft / non-tender  Hold (Positioning): Assistance needed to correctly position infant at breast and maintain latch.  LATCH Score: 7   Lactation Tools Discussed/Used    Interventions Interventions: Breast feeding basics reviewed;Assisted with latch;Skin to skin;Education  Discharge    Consult Status Consult Status: Follow-up Date: 04/21/23 Follow-up type: In-patient    Dahlia Byes [facility] 04/20/2023, 2:46 PM

## 2023-04-21 ENCOUNTER — Telehealth: Payer: Self-pay | Admitting: *Deleted

## 2023-04-21 ENCOUNTER — Encounter: Payer: 59 | Admitting: Obstetrics and Gynecology

## 2023-04-21 LAB — BIRTH TISSUE RECOVERY COLLECTION (PLACENTA DONATION)

## 2023-04-21 MED ORDER — BENZOCAINE-MENTHOL 20-0.5 % EX AERO: 1.0000 | INHALATION_SPRAY | CUTANEOUS | 0 refills | Status: DC | PRN

## 2023-04-21 MED ORDER — IBUPROFEN 600 MG PO TABS: 600.0000 mg | ORAL_TABLET | Freq: Four times a day (QID) | ORAL | 0 refills | Status: DC

## 2023-04-21 MED ORDER — FUROSEMIDE 20 MG PO TABS: 20.0000 mg | ORAL_TABLET | Freq: Every day | ORAL | 0 refills | Status: AC

## 2023-04-21 MED ORDER — ACETAMINOPHEN 325 MG PO TABS: 650.0000 mg | ORAL_TABLET | ORAL | 0 refills | Status: DC | PRN

## 2023-04-21 MED ORDER — WITCH HAZEL-GLYCERIN EX PADS: 1.0000 | MEDICATED_PAD | CUTANEOUS | 12 refills | Status: DC | PRN

## 2023-04-21 MED ORDER — SENNOSIDES-DOCUSATE SODIUM 8.6-50 MG PO TABS: 2.0000 | ORAL_TABLET | ORAL | 0 refills | Status: DC

## 2023-04-21 MED ORDER — NIFEDIPINE ER 30 MG PO TB24: 30.0000 mg | ORAL_TABLET | Freq: Every day | ORAL | 0 refills | Status: DC

## 2023-04-21 MED ORDER — FERROUS SULFATE 325 (65 FE) MG PO TABS
325.0000 mg | ORAL_TABLET | ORAL | Status: DC
  Administered 2023-04-21: 325 mg via ORAL
  Filled 2023-04-21: qty 1

## 2023-04-21 NOTE — Plan of Care (Signed)
Problem: Education: Goal: Knowledge of Childbirth will improve 04/21/2023 1701 by Donne Hazel, LPN Outcome: Adequate for Discharge 04/21/2023 1701 by Donne Hazel, LPN Outcome: Progressing 04/21/2023 0755 by Donne Hazel, LPN Outcome: Progressing Goal: Ability to make informed decisions regarding treatment and plan of care will improve 04/21/2023 1701 by Donne Hazel, LPN Outcome: Adequate for Discharge 04/21/2023 1701 by Donne Hazel, LPN Outcome: Progressing 04/21/2023 0755 by Donne Hazel, LPN Outcome: Progressing Goal: Ability to state and carry out methods to decrease the pain will improve 04/21/2023 1701 by Donne Hazel, LPN Outcome: Adequate for Discharge 04/21/2023 1701 by Donne Hazel, LPN Outcome: Progressing 04/21/2023 0755 by Donne Hazel, LPN Outcome: Progressing Goal: Individualized Educational Video(s) 04/21/2023 1701 by Donne Hazel, LPN Outcome: Adequate for Discharge 04/21/2023 1701 by Donne Hazel, LPN Outcome: Progressing 04/21/2023 0755 by Donne Hazel, LPN Outcome: Progressing   Problem: Coping: Goal: Ability to verbalize concerns and feelings about labor and delivery will improve 04/21/2023 1701 by Donne Hazel, LPN Outcome: Adequate for Discharge 04/21/2023 1701 by Donne Hazel, LPN Outcome: Progressing 04/21/2023 0755 by Donne Hazel, LPN Outcome: Progressing   Problem: Life Cycle: Goal: Ability to make normal progression through stages of labor will improve 04/21/2023 1701 by Donne Hazel, LPN Outcome: Adequate for Discharge 04/21/2023 1701 by Donne Hazel, LPN Outcome: Progressing 04/21/2023 0755 by Donne Hazel, LPN Outcome: Progressing Goal: Ability to effectively push during vaginal delivery will improve 04/21/2023 1701 by Donne Hazel, LPN Outcome: Adequate for Discharge 04/21/2023 1701 by Donne Hazel, LPN Outcome: Progressing 04/21/2023 0755 by Donne Hazel, LPN Outcome: Progressing   Problem: Role  Relationship: Goal: Will demonstrate positive interactions with the child 04/21/2023 1701 by Donne Hazel, LPN Outcome: Adequate for Discharge 04/21/2023 1701 by Donne Hazel, LPN Outcome: Progressing 04/21/2023 0755 by Donne Hazel, LPN Outcome: Progressing   Problem: Safety: Goal: Risk of complications during labor and delivery will decrease 04/21/2023 1701 by Donne Hazel, LPN Outcome: Adequate for Discharge 04/21/2023 1701 by Donne Hazel, LPN Outcome: Progressing 04/21/2023 0755 by Donne Hazel, LPN Outcome: Progressing   Problem: Pain Management: Goal: Relief or control of pain from uterine contractions will improve 04/21/2023 1701 by Donne Hazel, LPN Outcome: Adequate for Discharge 04/21/2023 1701 by Donne Hazel, LPN Outcome: Progressing 04/21/2023 0755 by Donne Hazel, LPN Outcome: Progressing   Problem: Education: Goal: Knowledge of disease or condition will improve 04/21/2023 1701 by Donne Hazel, LPN Outcome: Adequate for Discharge 04/21/2023 1701 by Donne Hazel, LPN Outcome: Progressing 04/21/2023 0755 by Donne Hazel, LPN Outcome: Progressing Goal: Knowledge of the prescribed therapeutic regimen will improve 04/21/2023 1701 by Donne Hazel, LPN Outcome: Adequate for Discharge 04/21/2023 1701 by Donne Hazel, LPN Outcome: Progressing 04/21/2023 0755 by Donne Hazel, LPN Outcome: Progressing   Problem: Fluid Volume: Goal: Peripheral tissue perfusion will improve 04/21/2023 1701 by Donne Hazel, LPN Outcome: Adequate for Discharge 04/21/2023 1701 by Donne Hazel, LPN Outcome: Progressing 04/21/2023 0755 by Donne Hazel, LPN Outcome: Progressing   Problem: Clinical Measurements: Goal: Complications related to disease process, condition or treatment will be avoided or minimized 04/21/2023 1701 by Donne Hazel, LPN Outcome: Adequate for Discharge 04/21/2023 1701 by Donne Hazel, LPN Outcome: Progressing 04/21/2023 0755 by Donne Hazel, LPN Outcome: Progressing   Problem: Education: Goal: Knowledge of General Education information will improve Description: Including  pain rating scale, medication(s)/side effects and non-pharmacologic comfort measures 04/21/2023 1701 by Donne Hazel, LPN Outcome: Adequate for Discharge 04/21/2023 1701 by Donne Hazel, LPN Outcome: Progressing 04/21/2023 0755 by Donne Hazel, LPN Outcome: Progressing   Problem: Health Behavior/Discharge Planning: Goal: Ability to manage health-related needs will improve 04/21/2023 1701 by Donne Hazel, LPN Outcome: Adequate for Discharge 04/21/2023 1701 by Donne Hazel, LPN Outcome: Progressing 04/21/2023 0755 by Donne Hazel, LPN Outcome: Progressing   Problem: Clinical Measurements: Goal: Ability to maintain clinical measurements within normal limits will improve 04/21/2023 1701 by Donne Hazel, LPN Outcome: Adequate for Discharge 04/21/2023 1701 by Donne Hazel, LPN Outcome: Progressing 04/21/2023 0755 by Donne Hazel, LPN Outcome: Progressing Goal: Will remain free from infection 04/21/2023 1701 by Donne Hazel, LPN Outcome: Adequate for Discharge 04/21/2023 1701 by Donne Hazel, LPN Outcome: Progressing 04/21/2023 0755 by Donne Hazel, LPN Outcome: Progressing Goal: Diagnostic test results will improve 04/21/2023 1701 by Donne Hazel, LPN Outcome: Adequate for Discharge 04/21/2023 1701 by Donne Hazel, LPN Outcome: Progressing 04/21/2023 0755 by Donne Hazel, LPN Outcome: Progressing Goal: Respiratory complications will improve 04/21/2023 1701 by Donne Hazel, LPN Outcome: Adequate for Discharge 04/21/2023 1701 by Donne Hazel, LPN Outcome: Progressing 04/21/2023 0755 by Donne Hazel, LPN Outcome: Progressing Goal: Cardiovascular complication will be avoided 04/21/2023 1701 by Donne Hazel, LPN Outcome: Adequate for Discharge 04/21/2023 1701 by Donne Hazel, LPN Outcome: Progressing 04/21/2023  0755 by Donne Hazel, LPN Outcome: Progressing   Problem: Activity: Goal: Risk for activity intolerance will decrease 04/21/2023 1701 by Donne Hazel, LPN Outcome: Adequate for Discharge 04/21/2023 1701 by Donne Hazel, LPN Outcome: Progressing 04/21/2023 0755 by Donne Hazel, LPN Outcome: Progressing   Problem: Nutrition: Goal: Adequate nutrition will be maintained 04/21/2023 1701 by Donne Hazel, LPN Outcome: Adequate for Discharge 04/21/2023 1701 by Donne Hazel, LPN Outcome: Progressing 04/21/2023 0755 by Donne Hazel, LPN Outcome: Progressing   Problem: Coping: Goal: Level of anxiety will decrease 04/21/2023 1701 by Donne Hazel, LPN Outcome: Adequate for Discharge 04/21/2023 1701 by Donne Hazel, LPN Outcome: Progressing 04/21/2023 0755 by Donne Hazel, LPN Outcome: Progressing   Problem: Elimination: Goal: Will not experience complications related to bowel motility 04/21/2023 1701 by Donne Hazel, LPN Outcome: Adequate for Discharge 04/21/2023 1701 by Donne Hazel, LPN Outcome: Progressing 04/21/2023 0755 by Donne Hazel, LPN Outcome: Progressing Goal: Will not experience complications related to urinary retention 04/21/2023 1701 by Donne Hazel, LPN Outcome: Adequate for Discharge 04/21/2023 1701 by Donne Hazel, LPN Outcome: Progressing 04/21/2023 0755 by Donne Hazel, LPN Outcome: Progressing   Problem: Pain Managment: Goal: General experience of comfort will improve 04/21/2023 1701 by Donne Hazel, LPN Outcome: Adequate for Discharge 04/21/2023 1701 by Donne Hazel, LPN Outcome: Progressing 04/21/2023 0755 by Donne Hazel, LPN Outcome: Progressing   Problem: Safety: Goal: Ability to remain free from injury will improve 04/21/2023 1701 by Donne Hazel, LPN Outcome: Adequate for Discharge 04/21/2023 1701 by Donne Hazel, LPN Outcome: Progressing 04/21/2023 0755 by Donne Hazel, LPN Outcome: Progressing   Problem: Skin  Integrity: Goal: Risk for impaired skin integrity will decrease 04/21/2023 1701 by Donne Hazel, LPN Outcome: Adequate for Discharge 04/21/2023 1701 by Donne Hazel, LPN Outcome: Progressing 04/21/2023 0755 by Donne Hazel, LPN Outcome: Progressing   Problem: Education: Goal: Knowledge of condition will improve  04/21/2023 1701 by Donne Hazel, LPN Outcome: Adequate for Discharge 04/21/2023 1701 by Donne Hazel, LPN Outcome: Progressing 04/21/2023 0755 by Donne Hazel, LPN Outcome: Progressing Goal: Individualized Educational Video(s) 04/21/2023 1701 by Donne Hazel, LPN Outcome: Adequate for Discharge 04/21/2023 1701 by Donne Hazel, LPN Outcome: Progressing 04/21/2023 0755 by Donne Hazel, LPN Outcome: Progressing Goal: Individualized Newborn Educational Video(s) 04/21/2023 1701 by Donne Hazel, LPN Outcome: Adequate for Discharge 04/21/2023 1701 by Donne Hazel, LPN Outcome: Progressing 04/21/2023 0755 by Donne Hazel, LPN Outcome: Progressing   Problem: Activity: Goal: Will verbalize the importance of balancing activity with adequate rest periods 04/21/2023 1701 by Donne Hazel, LPN Outcome: Adequate for Discharge 04/21/2023 1701 by Donne Hazel, LPN Outcome: Progressing 04/21/2023 0755 by Donne Hazel, LPN Outcome: Progressing Goal: Ability to tolerate increased activity will improve 04/21/2023 1701 by Donne Hazel, LPN Outcome: Adequate for Discharge 04/21/2023 1701 by Donne Hazel, LPN Outcome: Progressing 04/21/2023 0755 by Donne Hazel, LPN Outcome: Progressing   Problem: Coping: Goal: Ability to identify and utilize available resources and services will improve 04/21/2023 1701 by Donne Hazel, LPN Outcome: Adequate for Discharge 04/21/2023 1701 by Donne Hazel, LPN Outcome: Progressing 04/21/2023 0755 by Donne Hazel, LPN Outcome: Progressing   Problem: Life Cycle: Goal: Chance of risk for complications during the postpartum  period will decrease 04/21/2023 1701 by Donne Hazel, LPN Outcome: Adequate for Discharge 04/21/2023 1701 by Donne Hazel, LPN Outcome: Progressing 04/21/2023 0755 by Donne Hazel, LPN Outcome: Progressing   Problem: Role Relationship: Goal: Ability to demonstrate positive interaction with newborn will improve 04/21/2023 1701 by Donne Hazel, LPN Outcome: Adequate for Discharge 04/21/2023 1701 by Donne Hazel, LPN Outcome: Progressing 04/21/2023 0755 by Donne Hazel, LPN Outcome: Progressing   Problem: Skin Integrity: Goal: Demonstration of wound healing without infection will improve 04/21/2023 1701 by Donne Hazel, LPN Outcome: Adequate for Discharge 04/21/2023 1701 by Donne Hazel, LPN Outcome: Progressing 04/21/2023 0755 by Donne Hazel, LPN Outcome: Progressing

## 2023-04-21 NOTE — Lactation Note (Signed)
This note was copied from a baby's chart. Lactation Consultation Note  Patient Name: Melissa Sheppard [id number] Date: 04/21/2023 Age:34 hours  Reason for consult: Follow-up assessment;Term;Primapara;1st time breastfeeding  P1, GA [redacted]w[redacted]d, 6% weight loss  Mother has been breast and formula feeding. She states baby gets sleepy at breast and her nipples are a little sore. She denies bruising, cracking, bleeding nipples and states rest and coconut oil is helping. Denies need with latch assist. Baby bottle fed within the hour and sleeping now. Mother reports that she has a pump and will continue to work on breastfeeding once at home.  Reviewed supply/ demand, feeding 8-12 times/ 24, observing for feeding cues, pumping, and OP lactation support and follow up for establishing and maintaining breastfeeding, as needed.  Information given on engorgement and milk collection and storage. Mom made aware of O/P services, breastfeeding support groups, community resources, and our phone # for post-discharge questions.     Maternal Data Has patient been taught Hand Expression?: Yes Does the patient have breastfeeding experience prior to this delivery?: No  Feeding Mother's Current Feeding Choice: Breast Milk and Formula Nipple Type: Extra Slow Flow  LATCH Score  Not observed, offered to assist or return, mom will request if needed    Interventions Interventions: Education  Discharge Discharge Education: Engorgement and breast care;Warning signs for feeding baby;Other (comment) (discussed the benefits OP lactation support/ follow up) Pump: DEBP;Hands Free;Personal ([place])  Consult Status Consult Status: Complete Date: 04/21/23    Melissa Sheppard 04/21/2023, 9:46 AM

## 2023-04-21 NOTE — Plan of Care (Signed)
  Problem: Education: Goal: Knowledge of Childbirth will improve Outcome: Progressing Goal: Ability to make informed decisions regarding treatment and plan of care will improve Outcome: Progressing Goal: Ability to state and carry out methods to decrease the pain will improve Outcome: Progressing Goal: Individualized Educational Video(s) Outcome: Progressing   Problem: Coping: Goal: Ability to verbalize concerns and feelings about labor and delivery will improve Outcome: Progressing   Problem: Life Cycle: Goal: Ability to make normal progression through stages of labor will improve Outcome: Progressing Goal: Ability to effectively push during vaginal delivery will improve Outcome: Progressing   Problem: Role Relationship: Goal: Will demonstrate positive interactions with the child Outcome: Progressing   Problem: Safety: Goal: Risk of complications during labor and delivery will decrease Outcome: Progressing   Problem: Pain Management: Goal: Relief or control of pain from uterine contractions will improve Outcome: Progressing   Problem: Education: Goal: Knowledge of disease or condition will improve Outcome: Progressing Goal: Knowledge of the prescribed therapeutic regimen will improve Outcome: Progressing   Problem: Fluid Volume: Goal: Peripheral tissue perfusion will improve Outcome: Progressing   Problem: Clinical Measurements: Goal: Complications related to disease process, condition or treatment will be avoided or minimized Outcome: Progressing   Problem: Education: Goal: Knowledge of General Education information will improve Description: Including pain rating scale, medication(s)/side effects and non-pharmacologic comfort measures Outcome: Progressing   Problem: Health Behavior/Discharge Planning: Goal: Ability to manage health-related needs will improve Outcome: Progressing   Problem: Clinical Measurements: Goal: Ability to maintain clinical measurements  within normal limits will improve Outcome: Progressing Goal: Will remain free from infection Outcome: Progressing Goal: Diagnostic test results will improve Outcome: Progressing Goal: Respiratory complications will improve Outcome: Progressing Goal: Cardiovascular complication will be avoided Outcome: Progressing   Problem: Activity: Goal: Risk for activity intolerance will decrease Outcome: Progressing   Problem: Nutrition: Goal: Adequate nutrition will be maintained Outcome: Progressing   Problem: Coping: Goal: Level of anxiety will decrease Outcome: Progressing   Problem: Elimination: Goal: Will not experience complications related to bowel motility Outcome: Progressing Goal: Will not experience complications related to urinary retention Outcome: Progressing   Problem: Pain Managment: Goal: General experience of comfort will improve Outcome: Progressing   Problem: Safety: Goal: Ability to remain free from injury will improve Outcome: Progressing   Problem: Skin Integrity: Goal: Risk for impaired skin integrity will decrease Outcome: Progressing   Problem: Education: Goal: Knowledge of condition will improve Outcome: Progressing Goal: Individualized Educational Video(s) Outcome: Progressing Goal: Individualized Newborn Educational Video(s) Outcome: Progressing   Problem: Activity: Goal: Will verbalize the importance of balancing activity with adequate rest periods Outcome: Progressing Goal: Ability to tolerate increased activity will improve Outcome: Progressing   Problem: Coping: Goal: Ability to identify and utilize available resources and services will improve Outcome: Progressing   Problem: Life Cycle: Goal: Chance of risk for complications during the postpartum period will decrease Outcome: Progressing   Problem: Role Relationship: Goal: Ability to demonstrate positive interaction with newborn will improve Outcome: Progressing   Problem: Skin  Integrity: Goal: Demonstration of wound healing without infection will improve Outcome: Progressing

## 2023-04-21 NOTE — Telephone Encounter (Signed)
Left patient a message to call and schedule 6 week Postpartum appointment. 

## 2023-04-21 NOTE — Plan of Care (Signed)
Problem: Education: Goal: Knowledge of Childbirth will improve 04/21/2023 1701 by Donne Hazel, LPN Outcome: Progressing 04/21/2023 0755 by Donne Hazel, LPN Outcome: Progressing Goal: Ability to make informed decisions regarding treatment and plan of care will improve 04/21/2023 1701 by Donne Hazel, LPN Outcome: Progressing 04/21/2023 0755 by Donne Hazel, LPN Outcome: Progressing Goal: Ability to state and carry out methods to decrease the pain will improve 04/21/2023 1701 by Donne Hazel, LPN Outcome: Progressing 04/21/2023 0755 by Donne Hazel, LPN Outcome: Progressing Goal: Individualized Educational Video(s) 04/21/2023 1701 by Donne Hazel, LPN Outcome: Progressing 04/21/2023 0755 by Donne Hazel, LPN Outcome: Progressing   Problem: Coping: Goal: Ability to verbalize concerns and feelings about labor and delivery will improve 04/21/2023 1701 by Donne Hazel, LPN Outcome: Progressing 04/21/2023 0755 by Donne Hazel, LPN Outcome: Progressing   Problem: Life Cycle: Goal: Ability to make normal progression through stages of labor will improve 04/21/2023 1701 by Donne Hazel, LPN Outcome: Progressing 04/21/2023 0755 by Donne Hazel, LPN Outcome: Progressing Goal: Ability to effectively push during vaginal delivery will improve 04/21/2023 1701 by Donne Hazel, LPN Outcome: Progressing 04/21/2023 0755 by Donne Hazel, LPN Outcome: Progressing   Problem: Role Relationship: Goal: Will demonstrate positive interactions with the child 04/21/2023 1701 by Donne Hazel, LPN Outcome: Progressing 04/21/2023 0755 by Donne Hazel, LPN Outcome: Progressing   Problem: Safety: Goal: Risk of complications during labor and delivery will decrease 04/21/2023 1701 by Donne Hazel, LPN Outcome: Progressing 04/21/2023 0755 by Donne Hazel, LPN Outcome: Progressing   Problem: Pain Management: Goal: Relief or control of pain from uterine contractions will  improve 04/21/2023 1701 by Donne Hazel, LPN Outcome: Progressing 04/21/2023 0755 by Donne Hazel, LPN Outcome: Progressing   Problem: Education: Goal: Knowledge of disease or condition will improve 04/21/2023 1701 by Donne Hazel, LPN Outcome: Progressing 04/21/2023 0755 by Donne Hazel, LPN Outcome: Progressing Goal: Knowledge of the prescribed therapeutic regimen will improve 04/21/2023 1701 by Donne Hazel, LPN Outcome: Progressing 04/21/2023 0755 by Donne Hazel, LPN Outcome: Progressing   Problem: Fluid Volume: Goal: Peripheral tissue perfusion will improve 04/21/2023 1701 by Donne Hazel, LPN Outcome: Progressing 04/21/2023 0755 by Donne Hazel, LPN Outcome: Progressing   Problem: Clinical Measurements: Goal: Complications related to disease process, condition or treatment will be avoided or minimized 04/21/2023 1701 by Donne Hazel, LPN Outcome: Progressing 04/21/2023 0755 by Donne Hazel, LPN Outcome: Progressing   Problem: Education: Goal: Knowledge of General Education information will improve Description: Including pain rating scale, medication(s)/side effects and non-pharmacologic comfort measures 04/21/2023 1701 by Donne Hazel, LPN Outcome: Progressing 04/21/2023 0755 by Donne Hazel, LPN Outcome: Progressing   Problem: Health Behavior/Discharge Planning: Goal: Ability to manage health-related needs will improve 04/21/2023 1701 by Donne Hazel, LPN Outcome: Progressing 04/21/2023 0755 by Donne Hazel, LPN Outcome: Progressing   Problem: Clinical Measurements: Goal: Ability to maintain clinical measurements within normal limits will improve 04/21/2023 1701 by Donne Hazel, LPN Outcome: Progressing 04/21/2023 0755 by Donne Hazel, LPN Outcome: Progressing Goal: Will remain free from infection 04/21/2023 1701 by Donne Hazel, LPN Outcome: Progressing 04/21/2023 0755 by Donne Hazel, LPN Outcome: Progressing Goal: Diagnostic test  results will improve 04/21/2023 1701 by Donne Hazel, LPN Outcome: Progressing 04/21/2023 0755 by Donne Hazel, LPN Outcome: Progressing Goal: Respiratory complications will improve 04/21/2023 1701 by Donne Hazel, LPN Outcome:  Progressing 04/21/2023 0755 by Donne Hazel, LPN Outcome: Progressing Goal: Cardiovascular complication will be avoided 04/21/2023 1701 by Donne Hazel, LPN Outcome: Progressing 04/21/2023 0755 by Donne Hazel, LPN Outcome: Progressing   Problem: Activity: Goal: Risk for activity intolerance will decrease 04/21/2023 1701 by Donne Hazel, LPN Outcome: Progressing 04/21/2023 0755 by Donne Hazel, LPN Outcome: Progressing   Problem: Nutrition: Goal: Adequate nutrition will be maintained 04/21/2023 1701 by Donne Hazel, LPN Outcome: Progressing 04/21/2023 0755 by Donne Hazel, LPN Outcome: Progressing   Problem: Coping: Goal: Level of anxiety will decrease 04/21/2023 1701 by Donne Hazel, LPN Outcome: Progressing 04/21/2023 0755 by Donne Hazel, LPN Outcome: Progressing   Problem: Elimination: Goal: Will not experience complications related to bowel motility 04/21/2023 1701 by Donne Hazel, LPN Outcome: Progressing 04/21/2023 0755 by Donne Hazel, LPN Outcome: Progressing Goal: Will not experience complications related to urinary retention 04/21/2023 1701 by Donne Hazel, LPN Outcome: Progressing 04/21/2023 0755 by Donne Hazel, LPN Outcome: Progressing   Problem: Pain Managment: Goal: General experience of comfort will improve 04/21/2023 1701 by Donne Hazel, LPN Outcome: Progressing 04/21/2023 0755 by Donne Hazel, LPN Outcome: Progressing   Problem: Safety: Goal: Ability to remain free from injury will improve 04/21/2023 1701 by Donne Hazel, LPN Outcome: Progressing 04/21/2023 0755 by Donne Hazel, LPN Outcome: Progressing   Problem: Skin Integrity: Goal: Risk for impaired skin integrity will  decrease 04/21/2023 1701 by Donne Hazel, LPN Outcome: Progressing 04/21/2023 0755 by Donne Hazel, LPN Outcome: Progressing   Problem: Education: Goal: Knowledge of condition will improve 04/21/2023 1701 by Donne Hazel, LPN Outcome: Progressing 04/21/2023 0755 by Donne Hazel, LPN Outcome: Progressing Goal: Individualized Educational Video(s) 04/21/2023 1701 by Donne Hazel, LPN Outcome: Progressing 04/21/2023 0755 by Donne Hazel, LPN Outcome: Progressing Goal: Individualized Newborn Educational Video(s) 04/21/2023 1701 by Donne Hazel, LPN Outcome: Progressing 04/21/2023 0755 by Donne Hazel, LPN Outcome: Progressing   Problem: Activity: Goal: Will verbalize the importance of balancing activity with adequate rest periods 04/21/2023 1701 by Donne Hazel, LPN Outcome: Progressing 04/21/2023 0755 by Donne Hazel, LPN Outcome: Progressing Goal: Ability to tolerate increased activity will improve 04/21/2023 1701 by Donne Hazel, LPN Outcome: Progressing 04/21/2023 0755 by Donne Hazel, LPN Outcome: Progressing   Problem: Coping: Goal: Ability to identify and utilize available resources and services will improve 04/21/2023 1701 by Donne Hazel, LPN Outcome: Progressing 04/21/2023 0755 by Donne Hazel, LPN Outcome: Progressing   Problem: Life Cycle: Goal: Chance of risk for complications during the postpartum period will decrease 04/21/2023 1701 by Donne Hazel, LPN Outcome: Progressing 04/21/2023 0755 by Donne Hazel, LPN Outcome: Progressing   Problem: Role Relationship: Goal: Ability to demonstrate positive interaction with newborn will improve 04/21/2023 1701 by Donne Hazel, LPN Outcome: Progressing 04/21/2023 0755 by Donne Hazel, LPN Outcome: Progressing   Problem: Skin Integrity: Goal: Demonstration of wound healing without infection will improve 04/21/2023 1701 by Donne Hazel, LPN Outcome: Progressing 04/21/2023 0755 by Donne Hazel, LPN Outcome: Progressing

## 2023-04-28 ENCOUNTER — Telehealth (HOSPITAL_COMMUNITY): Payer: Self-pay

## 2023-04-28 NOTE — Telephone Encounter (Signed)
Patient is busy giving infant a bath. Will attempt call again at a later time.   Melissa Sheppard [facility]   04/28/23,1823

## 2023-04-30 ENCOUNTER — Telehealth (HOSPITAL_COMMUNITY): Payer: Self-pay

## 2023-04-30 NOTE — Telephone Encounter (Signed)
Patient did not answer phone call. Voicemail left for patient.   Suann Larry [facility]   04/30/23,1348

## 2023-05-04 ENCOUNTER — Inpatient Hospital Stay (HOSPITAL_COMMUNITY): Payer: 59

## 2023-05-04 ENCOUNTER — Inpatient Hospital Stay (HOSPITAL_COMMUNITY): Admission: AD | Admit: 2023-05-04 | Payer: 59 | Source: Home / Self Care | Admitting: Family Medicine

## 2023-05-30 NOTE — Progress Notes (Unsigned)
    Post Partum Visit Note  Melissa Sheppard is a 32 y.o. G33P1011 female who presents for a postpartum visit. She is 6 weeks postpartum following a normal spontaneous vaginal delivery.  I have fully reviewed the prenatal and intrapartum course. The delivery was at 38.6 gestational weeks.  Anesthesia: epidural and lidocaine. Postpartum course has been unremarkable. Baby is doing well. Baby is feeding by both breast and bottle - Similac Advance. Bleeding no bleeding. Bowel function is normal. Bladder function is normal. Patient is not sexually active. Contraception method is Nexplanon. Postpartum depression screening: negative.   The pregnancy intention screening data noted above was reviewed. Potential methods of contraception were discussed. The patient elected to proceed with No data recorded.    Health Maintenance Due  Topic Date Due   COVID-19 Vaccine (3 - 2023-24 season) 08/27/2022    The following portions of the patient's history were reviewed and updated as appropriate: allergies, current medications, past family history, past medical history, past social history, past surgical history, and problem list.  Review of Systems Pertinent items are noted in HPI.  Objective:  LMP 07/21/2022    General:  cooperative  Lungs: clear to auscultation bilaterally  Heart:  regular rate and rhythm, S1, S2 normal, no murmur, click, rub or gallop  Abdomen: soft, non-tender; bowel sounds normal; no masses,  no organomegaly        Assessment:    Normal postpartum exam.  Nexplanon placement.   Plan:   Essential components of care per ACOG recommendations:  1.  Mood and well being: Patient with negative depression screening today. Reviewed local resources for support.  - Patient tobacco use? No.   - hx of drug use? No.    2. Infant care and feeding:    3. Sexuality, contraception and birth spacing - Patient does not want a pregnancy in the next year.  Desired family size is 1  children.  - Reviewed reproductive life planning. Reviewed contraceptive methods based on pt preferences and effectiveness.  Patient desired Hormonal Implant today.   - Discussed birth spacing of 18 months  4. Sleep and fatigue -Encouraged family/partner/community support of 4 hrs of uninterrupted sleep to help with mood and fatigue  5. Physical Recovery  - Discussed patients delivery and complications. She describes her labor as good. - Patient had a Vaginal, no problems at delivery. Patient had a 2nd degree laceration. Perineal healing reviewed. Patient expressed understanding - Patient has urinary incontinence? No. - Patient is safe to resume physical and sexual activity  6.  Health Maintenance - HM due items addressed No -   - Last pap smear  Diagnosis  Date Value Ref Range Status  05/20/2022   Final   - Negative for intraepithelial lesion or malignancy (NILM)   Pap smear not done at today's visit.  -Breast Cancer screening indicated? No.   7. Chronic Disease/Pregnancy Condition follow up: None  - PCP follow up  Kathie Dike, CMA [facility] for [facility], [facility]   Duane Lope, NP 05/31/2023 2:34 PM

## 2023-05-31 ENCOUNTER — Ambulatory Visit (INDEPENDENT_AMBULATORY_CARE_PROVIDER_SITE_OTHER): Payer: 59 | Admitting: Obstetrics and Gynecology

## 2023-05-31 ENCOUNTER — Encounter: Payer: Self-pay | Admitting: Obstetrics and Gynecology

## 2023-05-31 VITALS — BP 135/87 | HR 60 | Ht 68.0 in | Wt 195.0 lb

## 2023-05-31 DIAGNOSIS — Z30017 Encounter for initial prescription of implantable subdermal contraceptive: Secondary | ICD-10-CM | POA: Diagnosis not present

## 2023-05-31 DIAGNOSIS — Z3202 Encounter for pregnancy test, result negative: Secondary | ICD-10-CM

## 2023-05-31 LAB — POCT URINE PREGNANCY: Preg Test, Ur: NEGATIVE

## 2023-05-31 MED ORDER — ETONOGESTREL 68 MG ~~LOC~~ IMPL
68.0000 mg | DRUG_IMPLANT | Freq: Once | SUBCUTANEOUS | Status: AC
Start: 2023-05-31 — End: 2023-05-31
  Administered 2023-05-31: 68 mg via SUBCUTANEOUS

## 2023-05-31 NOTE — Progress Notes (Signed)
     GYNECOLOGY OFFICE PROCEDURE NOTE  Melissa Sheppard is a 32 y.o. G2P1011 here for Nexplanon insertion.  Last pap smear was on 2023 and was normal.  No other gynecologic concerns.  Nexplanon Insertion Procedure Patient identified, informed consent performed, consent signed.   Patient does understand that irregular bleeding is a very common side effect of this medication. She was advised to have backup contraception for one week after placement. Pregnancy test in clinic today was negative.  Appropriate time out taken.  Patient's left arm was prepped and draped in the usual sterile fashion. The ruler used to measure and mark insertion area.  Patient was prepped with alcohol swab and then injected with 3 ml of 1% lidocaine.  She was prepped with betadine, Nexplanon removed from packaging,  Device confirmed in needle, then inserted full length of needle and withdrawn per handbook instructions. Nexplanon was able to palpated in the patient's arm; patient palpated the insert herself. There was minimal blood loss.  Patient insertion site covered with guaze and a pressure bandage to reduce any bruising.  The patient tolerated the procedure well and was given post procedure instructions.    Martice Doty, Harolyn Rutherford, NP  Faculty [facility] for [employer], [facility]

## 2023-09-27 ENCOUNTER — Telehealth: Payer: 59 | Admitting: Physician Assistant

## 2023-09-27 DIAGNOSIS — J069 Acute upper respiratory infection, unspecified: Secondary | ICD-10-CM | POA: Diagnosis not present

## 2023-09-27 MED ORDER — BENZONATATE 100 MG PO CAPS
100.0000 mg | ORAL_CAPSULE | Freq: Three times a day (TID) | ORAL | 0 refills | Status: DC | PRN
Start: 2023-09-27 — End: 2024-06-05

## 2023-09-27 MED ORDER — ALBUTEROL SULFATE HFA 108 (90 BASE) MCG/ACT IN AERS
2.0000 | INHALATION_SPRAY | Freq: Four times a day (QID) | RESPIRATORY_TRACT | 0 refills | Status: AC | PRN
Start: 2023-09-27 — End: ?

## 2023-09-27 NOTE — Progress Notes (Signed)
I have spent 5 minutes in review of e-visit questionnaire, review and updating patient chart, medical decision making and response to patient.   Mia Milan Cody Jacklynn Dehaas, PA-C    

## 2023-09-27 NOTE — Progress Notes (Signed)

## 2023-09-27 NOTE — Progress Notes (Signed)
Message sent to patient requesting further input regarding current symptoms. Awaiting patient response.  

## 2024-03-26 ENCOUNTER — Encounter: Payer: Self-pay | Admitting: Medical-Surgical

## 2024-03-26 ENCOUNTER — Ambulatory Visit (INDEPENDENT_AMBULATORY_CARE_PROVIDER_SITE_OTHER): Admitting: Sports Medicine

## 2024-03-26 ENCOUNTER — Encounter: Payer: Self-pay | Admitting: Sports Medicine

## 2024-03-26 VITALS — BP 126/73 | HR 90 | Temp 99.2°F | Resp 20 | Ht 68.0 in | Wt 204.7 lb

## 2024-03-26 DIAGNOSIS — J101 Influenza due to other identified influenza virus with other respiratory manifestations: Secondary | ICD-10-CM

## 2024-03-26 DIAGNOSIS — R6889 Other general symptoms and signs: Secondary | ICD-10-CM

## 2024-03-26 LAB — POC COVID19 BINAXNOW: SARS Coronavirus 2 Ag: NEGATIVE

## 2024-03-26 LAB — POCT INFLUENZA A/B
Influenza A, POC: POSITIVE — AB
Influenza B, POC: NEGATIVE

## 2024-03-26 NOTE — Progress Notes (Signed)
    Procedures performed today:    None.  Independent interpretation of notes and tests performed by another provider:   None.  Brief History, Exam, Impression, and Recommendations:    Influenza A This is a very pleasant 33 year old female, she has had about 3 to 4 days of malaise, fevers, chills, muscle aches and body aches, cough. Child was diagnosed with influenza recently. She did a TeleDoc appointment and was prescribed Tamiflu quite appropriately. Today her flu a test is positive, COVID-negative. She will finish out the Tamiflu, can use over-the-counter cold and flu medications, hydrate aggressively. Stay out of work until afebrile for 24 hours and wear well-fitting mask for 5 days afterwards. Return to see [place] as needed.   ____________________________________________ Ihor Austin. Benjamin Stain, M.D., ABFM., CAQSM., AME. Primary Care and Sports Medicine [facility]  Adjunct Professor of Family Medicine  [employer]  Restaurant manager, fast food

## 2024-03-26 NOTE — Telephone Encounter (Signed)
 Patient scheduled for today with DR. Thekkekandam.

## 2024-03-26 NOTE — Assessment & Plan Note (Signed)
 This is a very pleasant 33 year old female, she has had about 3 to 4 days of malaise, fevers, chills, muscle aches and body aches, cough. Child was diagnosed with influenza recently. She did a TeleDoc appointment and was prescribed Tamiflu quite appropriately. Today her flu a test is positive, COVID-negative. She will finish out the Tamiflu, can use over-the-counter cold and flu medications, hydrate aggressively. Stay out of work until afebrile for 24 hours and wear well-fitting mask for 5 days afterwards. Return to see [place] as needed.

## 2024-03-27 ENCOUNTER — Telehealth: Payer: Self-pay

## 2024-03-27 NOTE — Telephone Encounter (Signed)
 Copied from CRM [id number]. Topic: General - Other >> Mar 27, 2024  8:07 AM Suzette B wrote: Reason for CRM: Patient is needing a doctor note for her visit yesterday in which she tested positive for the flu. Patient stated the doctor's note should include visit from yesterday on up until Thursday. She would prefer it to be sent digitally over to MyChart so that she would have a record of it as well as have the ability to email it to her employer.

## 2024-03-27 NOTE — Telephone Encounter (Signed)
 Okay to write that note and upload to my chart

## 2024-03-28 NOTE — Telephone Encounter (Signed)
 Copied from CRM [id number]. Topic: General - Other >> Mar 28, 2024  9:01 AM Alcus Dad H wrote: Patient is calling to follow up on doctor's note. Says she doesn't see anything yet on her MyChart.

## 2024-03-28 NOTE — Telephone Encounter (Signed)
 Attempted call to patient. Phone rang without answer. Could not leave a voice mail messag.

## 2024-03-28 NOTE — Telephone Encounter (Signed)
 Molli Knock try now

## 2024-03-29 NOTE — Telephone Encounter (Signed)
 Patient informed and did receive the doctors note

## 2024-06-05 ENCOUNTER — Telehealth: Admitting: Emergency Medicine

## 2024-06-05 DIAGNOSIS — J069 Acute upper respiratory infection, unspecified: Secondary | ICD-10-CM

## 2024-06-05 MED ORDER — AZELASTINE HCL 0.1 % NA SOLN: 2.0000 | Freq: Two times a day (BID) | NASAL | 0 refills | Status: AC

## 2024-06-05 MED ORDER — BENZONATATE 100 MG PO CAPS
100.0000 mg | ORAL_CAPSULE | Freq: Three times a day (TID) | ORAL | 0 refills | Status: AC | PRN
Start: 2024-06-05 — End: ?

## 2024-06-05 NOTE — Progress Notes (Signed)
 E-Visit for Upper Respiratory Infection   We are sorry you are not feeling well.  Here is how we plan to help!  Based on what you have shared with me, it looks like you may have a viral upper respiratory infection.  Upper respiratory infections are caused by a large number of viruses; however, rhinovirus is the most common cause. COVID is also a possible cause and I recommend you test yourself for COVID at home.   Symptoms vary from person to person, with common symptoms including sore throat, cough, fatigue or lack of energy and feeling of general discomfort.  A low-grade fever of up to 100.4 may present, but is often uncommon.  Symptoms vary however, and are closely related to a person's age or underlying illnesses.  The most common symptoms associated with an upper respiratory infection are nasal discharge or congestion, cough, sneezing, headache and pressure in the ears and face. Eye drainage and itching, crusted eyes in the morning can also be a symptom. These symptoms usually persist for about 3 to 10 days, but can last up to 2 weeks.  It is important to know that upper respiratory infections do not cause serious illness or complications in most cases.    Upper respiratory infections can be transmitted from person to person, with the most common method of transmission being a person's hands.  The virus is able to live on the skin and can infect other persons for up to 2 hours after direct contact.  Also, these can be transmitted when someone coughs or sneezes; thus, it is important to cover the mouth to reduce this risk.  To keep the spread of the illness at bay, good hand hygiene is very important and consider wearing a mask around others.  This is an infection that is most likely caused by a virus. There are no specific treatments other than to help you with the symptoms until the infection runs its course.  We are sorry you are not feeling well.  Here is how we plan to help!   For nasal  congestion, you may use an oral decongestants such as Mucinex D or if you have glaucoma or high blood pressure use plain Mucinex.  Saline nasal spray or nasal drops can help and can safely be used as often as needed for congestion.  For your congestion, I have prescribed Azelastine nasal spray two sprays in each nostril twice a day  If you do not have a history of heart disease, hypertension, diabetes or thyroid disease, prostate/bladder issues or glaucoma, you may also use Sudafed to treat nasal congestion.  It is highly recommended that you consult with a pharmacist or your primary care physician to ensure this medication is safe for you to take.     If you have a cough, you may use cough suppressants such as Delsym and Robitussin.  If you have glaucoma or high blood pressure, you can also use Coricidin HBP.   For cough I have prescribed for you A prescription cough medication called Tessalon  Perles 100 mg. You may take 1-2 capsules every 8 hours as needed for cough  For your eye symptoms, I recommend using over the counter antihistamine eye drops, such as Pataday or Naphcon A (ok to use store/generic brands of these drops).   If you have a sore or scratchy throat, use a saltwater gargle-  to  teaspoon of salt dissolved in a 4-ounce to 8-ounce glass of warm water.  Gargle the solution for approximately  15-30 seconds and then spit.  It is important not to swallow the solution.  You can also use throat lozenges/cough drops and Chloraseptic spray to help with throat pain or discomfort.  Warm or cold liquids can also be helpful in relieving throat pain.  For headache, pain or general discomfort, you can use Ibuprofen  or Tylenol  as directed.   Some authorities believe that zinc sprays or the use of Echinacea may shorten the course of your symptoms.   HOME CARE Only take medications as instructed by your medical team. Be sure to drink plenty of fluids. Water is fine as well as fruit juices, sodas and  electrolyte beverages. You may want to stay away from caffeine or alcohol. If you are nauseated, try taking small sips of liquids. How do you know if you are getting enough fluid? Your urine should be a pale yellow or almost colorless. Get rest. Taking a steamy shower or using a humidifier may help nasal congestion and ease sore throat pain. You can place a towel over your head and breathe in the steam from hot water coming from a faucet. Using a saline nasal spray works much the same way. Cough drops, hard candies and sore throat lozenges may ease your cough. Avoid close contacts especially the very young and the elderly Cover your mouth if you cough or sneeze Always remember to wash your hands.   GET HELP RIGHT AWAY IF: You develop worsening fever. If your symptoms do not improve within 10 days You develop yellow or green discharge from your nose over 3 days. You have coughing fits You develop a severe head ache or visual changes. You develop shortness of breath, difficulty breathing or start having chest pain Your symptoms persist after you have completed your treatment plan  MAKE SURE YOU  Understand these instructions. Will watch your condition. Will get help right away if you are not doing well or get worse.  Thank you for choosing an e-visit.  Your e-visit answers were reviewed by a board certified advanced clinical practitioner to complete your personal care plan. Depending upon the condition, your plan could have included both over the counter or prescription medications.  Please review your pharmacy choice. Make sure the pharmacy is open so you can pick up prescription now. If there is a problem, you may contact your provider through [employer] and have the prescription routed to another pharmacy.  Your safety is important to us . If you have drug allergies check your prescription carefully.   For the next 24 hours you can use MyChart to ask questions about today's visit,  request a non-urgent call back, or ask for a work or school excuse. You will get an email in the next two days asking about your experience. I hope that your e-visit has been valuable and will speed your recovery.   I have spent 5 minutes in review of e-visit questionnaire, review and updating patient chart, medical decision making and response to patient.   Bart Born, PhD, FNP-BC

## 2024-08-28 ENCOUNTER — Encounter: Payer: Self-pay | Admitting: Sports Medicine

## 2025-01-07 ENCOUNTER — Encounter: Payer: Self-pay | Admitting: Medical-Surgical

## 2025-01-09 NOTE — Telephone Encounter (Signed)
Attempted call to patient. Mail box full. Could not leave a voice mail message.
